# Patient Record
Sex: Female | Born: 1950
Health system: Southern US, Community
[De-identification: ages and names within clinical notes are randomized; demographics above are authoritative.]

## PROBLEM LIST (undated history)

## (undated) DIAGNOSIS — A048 Other specified bacterial intestinal infections: Secondary | ICD-10-CM

## (undated) DIAGNOSIS — K449 Diaphragmatic hernia without obstruction or gangrene: Secondary | ICD-10-CM

## (undated) DIAGNOSIS — I10 Essential (primary) hypertension: Secondary | ICD-10-CM

## (undated) DIAGNOSIS — E785 Hyperlipidemia, unspecified: Secondary | ICD-10-CM

## (undated) DIAGNOSIS — K219 Gastro-esophageal reflux disease without esophagitis: Secondary | ICD-10-CM

## (undated) DIAGNOSIS — N189 Chronic kidney disease, unspecified: Secondary | ICD-10-CM

## (undated) DIAGNOSIS — E559 Vitamin D deficiency, unspecified: Secondary | ICD-10-CM

## (undated) DIAGNOSIS — H269 Unspecified cataract: Secondary | ICD-10-CM

## (undated) HISTORY — DX: Diaphragmatic hernia without obstruction or gangrene: K44.9

## (undated) HISTORY — DX: Vitamin D deficiency, unspecified: E55.9

## (undated) HISTORY — DX: Chronic kidney disease, unspecified: N18.9

## (undated) HISTORY — PX: OTHER SURGICAL HISTORY: SHX169

## (undated) HISTORY — DX: Gastro-esophageal reflux disease without esophagitis: K21.9

## (undated) HISTORY — DX: Other specified bacterial intestinal infections: A04.8

## (undated) HISTORY — DX: Unspecified cataract: H26.9

## (undated) HISTORY — DX: Essential (primary) hypertension: I10

## (undated) HISTORY — DX: Hyperlipidemia, unspecified: E78.5

---

## 1975-10-13 DIAGNOSIS — N189 Chronic kidney disease, unspecified: Secondary | ICD-10-CM

## 1975-10-13 HISTORY — DX: Chronic kidney disease, unspecified: N18.9

## 2011-03-05 ENCOUNTER — Encounter: Payer: Self-pay | Admitting: Nurse Practitioner

## 2011-03-05 DIAGNOSIS — M1990 Unspecified osteoarthritis, unspecified site: Secondary | ICD-10-CM | POA: Insufficient documentation

## 2011-03-05 DIAGNOSIS — K449 Diaphragmatic hernia without obstruction or gangrene: Secondary | ICD-10-CM | POA: Insufficient documentation

## 2011-03-05 DIAGNOSIS — E559 Vitamin D deficiency, unspecified: Secondary | ICD-10-CM | POA: Insufficient documentation

## 2011-03-05 DIAGNOSIS — E785 Hyperlipidemia, unspecified: Secondary | ICD-10-CM | POA: Insufficient documentation

## 2011-03-05 DIAGNOSIS — I10 Essential (primary) hypertension: Secondary | ICD-10-CM

## 2011-03-05 DIAGNOSIS — N281 Cyst of kidney, acquired: Secondary | ICD-10-CM | POA: Insufficient documentation

## 2011-03-05 DIAGNOSIS — K219 Gastro-esophageal reflux disease without esophagitis: Secondary | ICD-10-CM

## 2012-06-12 HISTORY — PX: JOINT REPLACEMENT: SHX530

## 2013-02-15 ENCOUNTER — Encounter: Payer: Self-pay | Admitting: Nurse Practitioner

## 2013-02-15 ENCOUNTER — Ambulatory Visit (INDEPENDENT_AMBULATORY_CARE_PROVIDER_SITE_OTHER): Payer: 59 | Admitting: Nurse Practitioner

## 2013-02-15 VITALS — BP 112/70 | HR 66 | Temp 98.3°F | Ht 69.5 in | Wt 197.0 lb

## 2013-02-15 DIAGNOSIS — K219 Gastro-esophageal reflux disease without esophagitis: Secondary | ICD-10-CM

## 2013-02-15 DIAGNOSIS — I10 Essential (primary) hypertension: Secondary | ICD-10-CM

## 2013-02-15 DIAGNOSIS — E559 Vitamin D deficiency, unspecified: Secondary | ICD-10-CM

## 2013-02-15 DIAGNOSIS — E785 Hyperlipidemia, unspecified: Secondary | ICD-10-CM

## 2013-02-15 LAB — COMPLETE METABOLIC PANEL WITH GFR
ALT: 25 U/L (ref 0–35)
AST: 16 U/L (ref 0–37)
Albumin: 4.4 g/dL (ref 3.5–5.2)
Alkaline Phosphatase: 70 U/L (ref 39–117)
BUN: 29 mg/dL — ABNORMAL HIGH (ref 6–23)
CO2: 27 mEq/L (ref 19–32)
Calcium: 9.8 mg/dL (ref 8.4–10.5)
Chloride: 101 mEq/L (ref 96–112)
Creat: 0.93 mg/dL (ref 0.50–1.10)
GFR, Est African American: 77 mL/min
GFR, Est Non African American: 67 mL/min
Glucose, Bld: 109 mg/dL — ABNORMAL HIGH (ref 70–99)
Potassium: 4.2 mEq/L (ref 3.5–5.3)
Sodium: 138 mEq/L (ref 135–145)
Total Bilirubin: 0.7 mg/dL (ref 0.3–1.2)
Total Protein: 6.7 g/dL (ref 6.0–8.3)

## 2013-02-15 MED ORDER — LOSARTAN POTASSIUM 50 MG PO TABS: 50.0000 mg | ORAL_TABLET | Freq: Every day | ORAL | Status: DC

## 2013-02-15 MED ORDER — HYDROCHLOROTHIAZIDE 25 MG PO TABS: 25.0000 mg | ORAL_TABLET | Freq: Every day | ORAL | Status: DC

## 2013-02-15 MED ORDER — OMEPRAZOLE-SODIUM BICARBONATE 20-1100 MG PO CAPS: 1.0000 | ORAL_CAPSULE | Freq: Every day | ORAL | Status: DC | PRN

## 2013-02-15 NOTE — Progress Notes (Signed)
Subjective:    Patient ID: Samantha Olson, female    DOB: 15-Jun-1951, 62 y.o.   MRN: [id number]  Gastrophageal Reflux She complains of belching. She reports no abdominal pain, no chest pain, no choking, no coughing, no dysphagia or no sore throat. This is a chronic problem. The current episode started more than 1 year ago. The problem occurs frequently. The symptoms are aggravated by caffeine and lying down. Treatments tried: patient has not been taking zegrid lately.  Hyperlipidemia This is a chronic problem. The current episode started more than 1 year ago. The problem is controlled. Recent lipid tests were reviewed and are low. Pertinent negatives include no chest pain, focal weakness, leg pain, myalgias or shortness of breath. She is currently on no antihyperlipidemic treatment (use to be on pravastatin but patient just quit taking.). Compliance problems include adherence to diet and adherence to exercise.   Hypertension This is a chronic problem. The current episode started more than 1 year ago. The problem is unchanged. The problem is controlled. Pertinent negatives include no chest pain, headaches, malaise/fatigue, peripheral edema, shortness of breath or sweats. There are no associated agents to hypertension. Risk factors for coronary artery disease include obesity and post-menopausal state. Past treatments include angiotensin blockers and diuretics. Compliance problems include diet and exercise.   Vitamin D deficiency Vitamin D OTC- No side effects    Review of Systems  Constitutional: Negative for malaise/fatigue.  HENT: Negative for sore throat.   Respiratory: Negative for cough, choking and shortness of breath.   Cardiovascular: Negative for chest pain.  Gastrointestinal: Negative for dysphagia and abdominal pain.  Musculoskeletal: Negative for myalgias.  Neurological: Negative for focal weakness and headaches.  All other systems reviewed and are negative.       Objective:   Physical Exam  Constitutional: She is oriented to person, place, and time. She appears well-developed and well-nourished.  HENT:  Nose: Nose normal.  Mouth/Throat: Oropharynx is clear and moist.  Eyes: EOM are normal.  Neck: Trachea normal, normal range of motion and full passive range of motion without pain. Neck supple. No JVD present. Carotid bruit is not present. No thyromegaly present.  Cardiovascular: Normal rate, regular rhythm, normal heart sounds and intact distal pulses.  Exam reveals no gallop and no friction rub.   No murmur heard. Pulmonary/Chest: Effort normal and breath sounds normal.  Abdominal: Soft. Bowel sounds are normal. She exhibits no distension and no mass. There is no tenderness.  Musculoskeletal: Normal range of motion.  Lymphadenopathy:    She has no cervical adenopathy.  Neurological: She is alert and oriented to person, place, and time. She has normal reflexes.  Skin: Skin is warm and dry.  Psychiatric: She has a normal mood and affect. Her behavior is normal. Judgment and thought content normal.   BP 112/70  Pulse 66  Temp(Src) 98.3 F (36.8 C) (Oral)  Ht 5' 9.5" (1.765 m)  Wt 197 lb (89.359 kg)  BMI 28.68 kg/m2        Assessment & Plan:  1. Hypertension Low Na+ diet - COMPLETE METABOLIC PANEL WITH GFR - losartan (COZAAR) 50 MG tablet; Take 1 tablet (50 mg total) by mouth daily.  Dispense: 30 tablet; Refill: 5 - hydrochlorothiazide (HYDRODIURIL) 25 MG tablet; Take 1 tablet (25 mg total) by mouth daily.  Dispense: 30 tablet; Refill: 5  2. Hyperlipidemia Low fat diet and exercise Will decide on going back on statin once get labs back - NMR Lipoprofile with Lipids  3. GERD (  gastroesophageal reflux disease) Avoid spicy and fatty foods Do Not eat 2 hours prior to bedtime - Omeprazole-Sodium Bicarbonate (ZEGERID) 20-1100 MG CAPS; Take 1 capsule by mouth daily as needed.  Dispense: 30 each; Refill: 5  4. Vitamin D deficiency - Vitamin D 25  hydroxy  Mary-Margaret Daphine Deutscher, FNP

## 2013-02-15 NOTE — Patient Instructions (Signed)
1. Hypertension Low Na+ diet - COMPLETE METABOLIC PANEL WITH GFR - losartan (COZAAR) 50 MG tablet; Take 1 tablet (50 mg total) by mouth daily.  Dispense: 30 tablet; Refill: 5 - hydrochlorothiazide (HYDRODIURIL) 25 MG tablet; Take 1 tablet (25 mg total) by mouth daily.  Dispense: 30 tablet; Refill: 5  2. Hyperlipidemia Low fat diet and exercise Will decide on going back on statin once get labs back - NMR Lipoprofile with Lipids  3. GERD (gastroesophageal reflux disease) Avoid spicy and fatty foods Do Not eat 2 hours prior to bedtime - Omeprazole-Sodium Bicarbonate (ZEGERID) 20-1100 MG CAPS; Take 1 capsule by mouth daily as needed.  Dispense: 30 each; Refill: 5  4. Vitamin D deficiency - Vitamin D 25 hydroxy  Samantha Daphine Deutscher, FNP

## 2013-02-16 LAB — VITAMIN D 25 HYDROXY (VIT D DEFICIENCY, FRACTURES): Vit D, 25-Hydroxy: 62 ng/mL (ref 30–89)

## 2013-02-17 LAB — NMR LIPOPROFILE WITH LIPIDS
Cholesterol, Total: 203 mg/dL — ABNORMAL HIGH (ref <200)
HDL Particle Number: 41.4 umol/L (ref >=30.5)
HDL Size: 9.5 nm (ref >=9.2)
HDL-C: 74 mg/dL (ref >=40)
LDL (calc): 114 mg/dL — ABNORMAL HIGH (ref <100)
LDL Particle Number: 1280 nmol/L — ABNORMAL HIGH (ref <1000)
LDL Size: 21.7 nm (ref >20.5)
LP-IR Score: 31 (ref <=45)
Large HDL-P: 10.8 umol/L (ref >=4.8)
Large VLDL-P: 3.4 nmol/L — ABNORMAL HIGH (ref <=2.7)
Small LDL Particle Number: 273 nmol/L (ref <=527)
Triglycerides: 75 mg/dL (ref <150)
VLDL Size: 46.8 nm — ABNORMAL HIGH (ref <=46.6)

## 2013-06-30 ENCOUNTER — Ambulatory Visit (INDEPENDENT_AMBULATORY_CARE_PROVIDER_SITE_OTHER): Payer: 59 | Admitting: Family Medicine

## 2013-06-30 ENCOUNTER — Ambulatory Visit (INDEPENDENT_AMBULATORY_CARE_PROVIDER_SITE_OTHER): Payer: 59

## 2013-06-30 ENCOUNTER — Encounter: Payer: Self-pay | Admitting: Family Medicine

## 2013-06-30 VITALS — BP 124/75 | HR 67 | Temp 96.7°F | Ht 69.5 in | Wt 195.0 lb

## 2013-06-30 DIAGNOSIS — M545 Low back pain, unspecified: Secondary | ICD-10-CM

## 2013-06-30 DIAGNOSIS — S335XXA Sprain of ligaments of lumbar spine, initial encounter: Secondary | ICD-10-CM

## 2013-06-30 DIAGNOSIS — S39012A Strain of muscle, fascia and tendon of lower back, initial encounter: Secondary | ICD-10-CM

## 2013-06-30 MED ORDER — PREDNISONE 50 MG PO TABS: ORAL_TABLET | ORAL | Status: DC

## 2013-06-30 MED ORDER — CYCLOBENZAPRINE HCL 10 MG PO TABS: 10.0000 mg | ORAL_TABLET | Freq: Three times a day (TID) | ORAL | Status: DC | PRN

## 2013-06-30 NOTE — Progress Notes (Signed)
  Subjective:    Patient ID: Samantha Olson, female    DOB: 12-23-1950, 62 y.o.   MRN: [id number]  HPI  The patient presents today with back pain. Location: lumbar region  Timing: constant. Has been present for last 1-2 weeks. Modifying factors: Baseline hx/o arthritis s/p R TKR 1 year ago  Description: Persistent mild to moderate lumbar back pain.  Worse with: standing, flexion, extension  Better with: some rest  Trauma: dogged pulled her when she was walking him Bladder/bowel incontinence: no Weakness: no, has had some mild radiation into buttocks bilaterally  Fever/chills: no Night pain:no Unexplained weight loss: no Cancer/immunosuppression: no PMH of osteoporosis or chronic steroid use:  no     Review of Systems  All other systems reviewed and are negative.       Objective:   Physical Exam  Constitutional: She appears well-developed and well-nourished.  HENT:  Head: Normocephalic and atraumatic.  Eyes: Conjunctivae are normal. Pupils are equal, round, and reactive to light.  Neck: Normal range of motion. Neck supple.  Cardiovascular: Normal rate and regular rhythm.   Pulmonary/Chest: Effort normal and breath sounds normal.  Abdominal: Soft.  Musculoskeletal:       Arms: + TTP in lumbar region.  FABER negative.  Neurovascularly intact distally    Neurological: She is alert.  Skin: Skin is warm.    [facility] reading (PRIMARY) by  Dr. Alvester Morin Preliminary read: Noted degenerative changes consistent arthritis. Questionable loss of height space and L5/S1 disc space. No acute fracture dislocation preliminary.                                        Assessment & Plan:  Low back pain - Plan: DG Lumbar Spine 2-3 Views  Lumbar strain, initial encounter - Plan: predniSONE (DELTASONE) 50 MG tablet, cyclobenzaprine (FLEXERIL) 10 MG tablet  Will treat with prednisone Flexeril for acute treatment. Neurovascularly intact on exam. Discussed general care musculoskeletal red  flags. Followup as needed.

## 2013-07-27 ENCOUNTER — Encounter: Payer: Self-pay | Admitting: Family Medicine

## 2013-07-27 ENCOUNTER — Ambulatory Visit (INDEPENDENT_AMBULATORY_CARE_PROVIDER_SITE_OTHER): Payer: 59 | Admitting: Family Medicine

## 2013-07-27 VITALS — BP 129/78 | Temp 97.3°F | Resp 72 | Wt 194.2 lb

## 2013-07-27 DIAGNOSIS — I10 Essential (primary) hypertension: Secondary | ICD-10-CM

## 2013-07-27 MED ORDER — LOSARTAN POTASSIUM 50 MG PO TABS: 50.0000 mg | ORAL_TABLET | Freq: Every day | ORAL | Status: DC

## 2013-07-27 MED ORDER — HYDROCHLOROTHIAZIDE 25 MG PO TABS: 25.0000 mg | ORAL_TABLET | Freq: Every day | ORAL | Status: DC

## 2013-07-27 NOTE — Patient Instructions (Signed)

## 2013-07-27 NOTE — Progress Notes (Signed)
  Subjective:    Patient ID: Samantha Olson, female    DOB: Jun 12, 1951, 62 y.o.   MRN: [id number]  HPI This 62 y.o. female presents for evaluation of hypertension.  She has No acute complaints.   Review of Systems No chest pain, SOB, HA, dizziness, vision change, N/V, diarrhea, constipation, dysuria, urinary urgency or frequency, myalgias, arthralgias or rash.     Objective:   Physical Exam Vital signs noted  Well developed well nourished female.  HEENT - Head atraumatic Normocephalic                Eyes - PERRLA, Conjuctiva - clear Sclera- Clear EOMI                Ears - EAC's Wnl TM's Wnl Gross Hearing WNL                Nose - Nares patent                 Throat - oropharanx wnl Respiratory - Lungs CTA bilateral Cardiac - RRR S1 and S2 without murmur GI - Abdomen soft Nontender and bowel sounds active x 4 Extremities - No edema. Neuro - Grossly intact.       Assessment & Plan:  Hypertension - Plan: hydrochlorothiazide (HYDRODIURIL) 25 MG tablet, losartan (COZAAR) 50 MG tablet  Follow up in 6 months  Deatra Canter FNP

## 2014-01-22 ENCOUNTER — Ambulatory Visit (INDEPENDENT_AMBULATORY_CARE_PROVIDER_SITE_OTHER): Payer: 59 | Admitting: Family Medicine

## 2014-01-22 ENCOUNTER — Encounter: Payer: Self-pay | Admitting: Family Medicine

## 2014-01-22 VITALS — BP 122/74 | HR 74 | Temp 97.0°F | Ht 69.5 in | Wt 202.0 lb

## 2014-01-22 DIAGNOSIS — I10 Essential (primary) hypertension: Secondary | ICD-10-CM

## 2014-01-22 DIAGNOSIS — M129 Arthropathy, unspecified: Secondary | ICD-10-CM

## 2014-01-22 DIAGNOSIS — M1990 Unspecified osteoarthritis, unspecified site: Secondary | ICD-10-CM

## 2014-01-22 MED ORDER — MELOXICAM 15 MG PO TABS: 15.0000 mg | ORAL_TABLET | Freq: Every day | ORAL | Status: DC

## 2014-01-22 MED ORDER — HYDROCHLOROTHIAZIDE 25 MG PO TABS: 25.0000 mg | ORAL_TABLET | Freq: Every day | ORAL | Status: DC

## 2014-01-22 MED ORDER — LOSARTAN POTASSIUM 50 MG PO TABS: 50.0000 mg | ORAL_TABLET | Freq: Every day | ORAL | Status: DC

## 2014-01-22 NOTE — Progress Notes (Signed)
   Subjective:    Patient ID: Samantha Olson, female    DOB: [birth year], 63 y.o.   MRN: [id number]  HPI  This 63 y.o. female presents for evaluation of arthritis in her knees and needs refills on bp medication. She would like to wait on labs until next visit.  Review of Systems No chest pain, SOB, HA, dizziness, vision change, N/V, diarrhea, constipation, dysuria, urinary urgency or frequency, myalgias, arthralgias or rash.     Objective:   Physical Exam Vital signs noted  Well developed well nourished female.  HEENT - Head atraumatic Normocephalic                Eyes - PERRLA, Conjuctiva - clear Sclera- Clear EOMI                Ears - EAC's Wnl TM's Wnl Gross Hearing WNL                Throat - oropharanx wnl Respiratory - Lungs CTA bilateral Cardiac - RRR S1 and S2 without murmur GI - Abdomen soft Nontender and bowel sounds active x 4 Extremities - No edema. Neuro - Grossly intact. MS - Tenderness bilateral knees       Assessment & Plan:  Hypertension - Plan: hydrochlorothiazide (HYDRODIURIL) 25 MG tablet, losartan (COZAAR) 50 MG tablet  Arthritis - Plan: meloxicam (MOBIC) 15 MG tablet  Labs next visit  Follow up in 6 months  Lysbeth Penner FNP

## 2014-02-05 LAB — HM MAMMOGRAPHY

## 2014-03-30 ENCOUNTER — Encounter: Payer: Self-pay | Admitting: Nurse Practitioner

## 2014-03-30 ENCOUNTER — Ambulatory Visit (INDEPENDENT_AMBULATORY_CARE_PROVIDER_SITE_OTHER): Payer: 59 | Admitting: Nurse Practitioner

## 2014-03-30 VITALS — BP 110/72 | HR 80 | Temp 98.1°F | Ht 69.0 in | Wt 200.0 lb

## 2014-03-30 DIAGNOSIS — B085 Enteroviral vesicular pharyngitis: Secondary | ICD-10-CM

## 2014-03-30 DIAGNOSIS — J029 Acute pharyngitis, unspecified: Secondary | ICD-10-CM

## 2014-03-30 LAB — POCT RAPID STREP A (OFFICE): Rapid Strep A Screen: NEGATIVE

## 2014-03-30 NOTE — Progress Notes (Signed)
   Subjective:    Patient ID: Samantha Olson, female    DOB: 10/03/1951, 63 y.o.   MRN: [id number]  HPI Patient here today c/o sore throat that started Wednesday- sore all day longer- pain with swallowing- no fever.    Review of Systems  Constitutional: Positive for chills and fatigue. Negative for fever.  HENT: Positive for congestion, sore throat and trouble swallowing. Negative for ear pain, postnasal drip, rhinorrhea and sinus pressure.   Respiratory: Negative for cough.   Cardiovascular: Negative.   Gastrointestinal: Negative.   Neurological: Negative.   Psychiatric/Behavioral: Negative.   All other systems reviewed and are negative.      Objective:   Physical Exam  Constitutional: She is oriented to person, place, and time. She appears well-developed and well-nourished. No distress.  HENT:  Right Ear: External ear normal.  Left Ear: External ear normal.  Nose: No mucosal edema or rhinorrhea. Right sinus exhibits no maxillary sinus tenderness and no frontal sinus tenderness. Left sinus exhibits no maxillary sinus tenderness and no frontal sinus tenderness.  Mouth/Throat: Mucous membranes are normal. Oral lesions (vesicular lesions on uvula) present. Posterior oropharyngeal erythema present.  Eyes: Pupils are equal, round, and reactive to light.  Neck: Normal range of motion. Neck supple.  Cardiovascular: Normal rate, regular rhythm and normal heart sounds.   Pulmonary/Chest: Effort normal.  Lymphadenopathy:    She has no cervical adenopathy.  Neurological: She is alert and oriented to person, place, and time.  Skin: Skin is warm.  Psychiatric: She has a normal mood and affect. Her behavior is normal. Judgment and thought content normal.    BP 110/72  Pulse 80  Temp(Src) 98.1 F (36.7 C) (Oral)  Ht 5\' 9"  (1.753 m)  Wt 200 lb (90.719 kg)  BMI 29.52 kg/m2  Results for orders placed in visit on 03/30/14  POCT RAPID STREP A (OFFICE)      Result Value Ref Range   Rapid  Strep A Screen Negative  Negative        Assessment & Plan:   1. Sore throat   2. Herpangina    Throat lozgenses Cold foods Gargle with warm salt water Force fluids Motrin and tylenol alternate every 3 hours till better  Mary-Margaret Hassell Done, FNP

## 2014-03-30 NOTE — Patient Instructions (Signed)

## 2014-07-31 ENCOUNTER — Ambulatory Visit (INDEPENDENT_AMBULATORY_CARE_PROVIDER_SITE_OTHER): Payer: 59

## 2014-07-31 DIAGNOSIS — Z23 Encounter for immunization: Secondary | ICD-10-CM

## 2014-08-23 ENCOUNTER — Encounter: Payer: Self-pay | Admitting: Family Medicine

## 2014-08-23 ENCOUNTER — Ambulatory Visit (INDEPENDENT_AMBULATORY_CARE_PROVIDER_SITE_OTHER): Payer: 59 | Admitting: Family Medicine

## 2014-08-23 ENCOUNTER — Telehealth: Payer: Self-pay | Admitting: Nurse Practitioner

## 2014-08-23 ENCOUNTER — Ambulatory Visit (INDEPENDENT_AMBULATORY_CARE_PROVIDER_SITE_OTHER): Payer: 59

## 2014-08-23 VITALS — BP 135/76 | HR 86 | Temp 96.9°F | Ht 69.0 in | Wt 202.0 lb

## 2014-08-23 DIAGNOSIS — R8281 Pyuria: Secondary | ICD-10-CM

## 2014-08-23 DIAGNOSIS — N390 Urinary tract infection, site not specified: Secondary | ICD-10-CM

## 2014-08-23 DIAGNOSIS — G629 Polyneuropathy, unspecified: Secondary | ICD-10-CM

## 2014-08-23 DIAGNOSIS — R109 Unspecified abdominal pain: Secondary | ICD-10-CM

## 2014-08-23 DIAGNOSIS — S39012A Strain of muscle, fascia and tendon of lower back, initial encounter: Secondary | ICD-10-CM

## 2014-08-23 LAB — POCT UA - MICROSCOPIC ONLY
Casts, Ur, LPF, POC: NEGATIVE
Crystals, Ur, HPF, POC: NEGATIVE
Mucus, UA: NEGATIVE
Yeast, UA: NEGATIVE

## 2014-08-23 LAB — POCT URINALYSIS DIPSTICK
Bilirubin, UA: NEGATIVE
Blood, UA: NEGATIVE
Glucose, UA: NEGATIVE
Ketones, UA: NEGATIVE
Nitrite, UA: NEGATIVE
Protein, UA: NEGATIVE
Spec Grav, UA: 1.02
Urobilinogen, UA: NEGATIVE
pH, UA: 6

## 2014-08-23 MED ORDER — MELOXICAM 15 MG PO TABS: ORAL_TABLET | ORAL | Status: DC

## 2014-08-23 NOTE — Telephone Encounter (Signed)
Appt given per patient request 

## 2014-08-23 NOTE — Progress Notes (Signed)
Subjective:    Patient ID: Samantha Olson, female    DOB: Oct 01, 1951, 63 y.o.   MRN: [id number]  HPI Patient here today for left side abdominal pain that started on Monday. The patient indicates that she was doing some lifting of boxes and packages during the weekend and on Monday. The discomfort in her left side above the pelvic ramus started Monday evening. The pain wakes sharp in nature. It would come and go. It was not necessarily related to activity but she did comment that when she was in the bed at night that movement does seem to aggravate the discomfort. She does have this remote history of an ovarian cyst and a kidney cyst. She had a pelvic exam in September and was told that everything was fine with a pelvic exam.       Patient Active Problem List   Diagnosis Date Noted  . HTN (hypertension) 03/05/2011  . Arthritis 03/05/2011  . GERD (gastroesophageal reflux disease) 03/05/2011  . Hiatal hernia 03/05/2011  . Hyperlipidemia 03/05/2011  . Vitamin D deficiency 03/05/2011  . Renal cyst 03/05/2011   Outpatient Encounter Prescriptions as of 08/23/2014  Medication Sig  . Cholecalciferol (VITAMIN D) 2000 UNITS CAPS Take by mouth. 1 daily    . hydrochlorothiazide (HYDRODIURIL) 25 MG tablet Take 1 tablet (25 mg total) by mouth daily.  Marland Kitchen losartan (COZAAR) 50 MG tablet Take 1 tablet (50 mg total) by mouth daily.  Earney Navy Bicarbonate (ZEGERID) 20-1100 MG CAPS Take 1 capsule by mouth daily as needed.  . [DISCONTINUED] meloxicam (MOBIC) 15 MG tablet Take 1 tablet (15 mg total) by mouth daily.    Review of Systems  Constitutional: Negative.  Negative for fever.  HENT: Negative.   Eyes: Negative.   Respiratory: Negative.   Cardiovascular: Negative.   Gastrointestinal: Positive for abdominal pain (left side ).  Endocrine: Negative.   Genitourinary: Negative.  Negative for dysuria, urgency and frequency.  Musculoskeletal: Negative.   Skin: Negative.     Allergic/Immunologic: Negative.   Neurological: Negative.   Hematological: Negative.   Psychiatric/Behavioral: Negative.        Objective:   Physical Exam  Constitutional: She is oriented to person, place, and time. She appears well-developed and well-nourished. No distress.  HENT:  Head: Normocephalic.  Eyes: Conjunctivae and EOM are normal. Pupils are equal, round, and reactive to light. Right eye exhibits no discharge. Left eye exhibits no discharge. No scleral icterus.  Neck: Normal range of motion.  Cardiovascular: Normal rate, regular rhythm and normal heart sounds.   No murmur heard. Pulmonary/Chest: Effort normal and breath sounds normal. No respiratory distress. She has no wheezes. She has no rales. She exhibits no tenderness.  Abdominal: Soft. Bowel sounds are normal. She exhibits no mass. There is tenderness. There is no rebound and no guarding.  There is slight tenderness left side of abdomen with no masses or inguinal adenopathy  Musculoskeletal: Normal range of motion. She exhibits no edema.  Neurological: She is alert and oriented to person, place, and time. She has normal reflexes. No cranial nerve deficit.  Leg raising was good bilaterally. Hip abduction and abduction were good bilaterally. Leg raising against pressure was good bilaterally. The patient was able to get up from the supine position without any occurrence of pain in her side.  Skin: Skin is warm and dry.  Psychiatric: She has a normal mood and affect. Her behavior is normal. Judgment and thought content normal.  Nursing note and vitals reviewed.  BP 135/76 mmHg  Pulse 86  Temp(Src) 96.9 F (36.1 C) (Oral)  Ht _0  (1.753 m)  Wt 202 lb (91.627 kg)  BMI 29.82 kg/m2  [facility] reading (PRIMARY) by  Dr. France Ravens--  Degenerative changes L1 and 2 and L5-S1                           Results for orders placed or performed in visit on 08/23/14  POCT urinalysis dipstick  Result Value Ref Range   Color, UA  gold    Clarity, UA clear    Glucose, UA negative    Bilirubin, UA negative    Ketones, UA negative    Spec Grav, UA 1.020    Blood, UA negative    pH, UA 6.0    Protein, UA negative    Urobilinogen, UA negative    Nitrite, UA negative    Leukocytes, UA moderate (2+)   POCT UA - Microscopic Only  Result Value Ref Range   WBC, Ur, HPF, POC 10-12    RBC, urine, microscopic 1-3    Bacteria, U Microscopic few    Mucus, UA negative    Epithelial cells, urine per micros few    Crystals, Ur, HPF, POC negative    Casts, Ur, LPF, POC negative    Yeast, UA negative    The patient was informed of the urinalysis result and the CBC result. A culture and sensitivity is pending on the urine. We will not treat this unless the culture comes back positive and the patient was aware of this.           Assessment & Plan:  1. Left sided abdominal pain - BMP8+EGFR - POCT CBC - POCT urinalysis dipstick - POCT UA - Microscopic Only - Urine culture - meloxicam (MOBIC) 15 MG tablet; 1/2 to 1 whole tab daily after eating  Dispense: 30 tablet; Refill: 0 - DG Lumbar Spine 2-3 Views; Future  2. Neuropathy - POCT urinalysis dipstick - POCT UA - Microscopic Only - Urine culture - meloxicam (MOBIC) 15 MG tablet; 1/2 to 1 whole tab daily after eating  Dispense: 30 tablet; Refill: 0 - DG Lumbar Spine 2-3 Views; Future  3. Low back strain, initial encounter  Patient Instructions  Avoid heavy lifting pushing or pulling or straining Use warm wet compresses to low back Take NSAID as directed   Arrie Senate MD

## 2014-08-23 NOTE — Patient Instructions (Signed)
Avoid heavy lifting pushing or pulling or straining Use warm wet compresses to low back Take NSAID as directed

## 2014-08-24 ENCOUNTER — Other Ambulatory Visit: Payer: Self-pay | Admitting: Family Medicine

## 2014-08-24 ENCOUNTER — Telehealth: Payer: Self-pay

## 2014-08-24 LAB — BMP8+EGFR
BUN/Creatinine Ratio: 23 (ref 11–26)
BUN: 23 mg/dL (ref 8–27)
CO2: 28 mmol/L (ref 18–29)
Calcium: 10 mg/dL (ref 8.7–10.3)
Chloride: 98 mmol/L (ref 97–108)
Creatinine, Ser: 0.98 mg/dL (ref 0.57–1.00)
GFR calc Af Amer: 71 mL/min/{1.73_m2} (ref >59)
GFR calc non Af Amer: 62 mL/min/{1.73_m2} (ref >59)
Glucose: 122 mg/dL — ABNORMAL HIGH (ref 65–99)
Potassium: 3.6 mmol/L (ref 3.5–5.2)
Sodium: 141 mmol/L (ref 134–144)

## 2014-08-24 NOTE — Telephone Encounter (Signed)
[facility] to x-ray 

## 2014-08-24 NOTE — Telephone Encounter (Signed)
Pt aware of x-ray results and was reminded to call office before she leaves to go oot on Monday for urine culture results

## 2014-08-24 NOTE — Telephone Encounter (Signed)
-----   Message from Chipper Herb, MD sent at 08/24/2014  1:03 PM EST ----- As per radiology report--she has multi-level degenerative changes. This could certainly play a role with her side paincav and see if this helps the discomfort. Hopefully we will have the urine culture back before she leaves on her trip. She should call us on Monday to check the urine culture result if she has not heard from [place] by then.

## 2014-08-25 LAB — URINE CULTURE

## 2015-02-05 ENCOUNTER — Telehealth: Payer: Self-pay | Admitting: Nurse Practitioner

## 2015-02-05 NOTE — Telephone Encounter (Signed)
Appointment given for tomorrow with Tiffany.

## 2015-02-06 ENCOUNTER — Ambulatory Visit (INDEPENDENT_AMBULATORY_CARE_PROVIDER_SITE_OTHER): Payer: 59

## 2015-02-06 ENCOUNTER — Encounter: Payer: Self-pay | Admitting: Physician Assistant

## 2015-02-06 ENCOUNTER — Ambulatory Visit (INDEPENDENT_AMBULATORY_CARE_PROVIDER_SITE_OTHER): Payer: 59 | Admitting: Physician Assistant

## 2015-02-06 VITALS — BP 131/74 | HR 68 | Temp 97.0°F | Ht 69.0 in | Wt 203.0 lb

## 2015-02-06 DIAGNOSIS — M79672 Pain in left foot: Secondary | ICD-10-CM

## 2015-02-06 DIAGNOSIS — E559 Vitamin D deficiency, unspecified: Secondary | ICD-10-CM | POA: Diagnosis not present

## 2015-02-06 DIAGNOSIS — S93602A Unspecified sprain of left foot, initial encounter: Secondary | ICD-10-CM | POA: Diagnosis not present

## 2015-02-06 DIAGNOSIS — I10 Essential (primary) hypertension: Secondary | ICD-10-CM

## 2015-02-06 DIAGNOSIS — Z13220 Encounter for screening for lipoid disorders: Secondary | ICD-10-CM | POA: Diagnosis not present

## 2015-02-06 LAB — POCT CBC
Granulocyte percent: 54.8 %G (ref 37–80)
HCT, POC: 45.9 % (ref 37.7–47.9)
Hemoglobin: 14.6 g/dL (ref 12.2–16.2)
Lymph, poc: 2.4 (ref 0.6–3.4)
MCH, POC: 28 pg (ref 27–31.2)
MCHC: 31.8 g/dL (ref 31.8–35.4)
MCV: 87.8 fL (ref 80–97)
MPV: 8.9 fL (ref 0–99.8)
POC Granulocyte: 3.5 (ref 2–6.9)
POC LYMPH PERCENT: 37.1 %L (ref 10–50)
Platelet Count, POC: 253 10*3/uL (ref 142–424)
RBC: 5.23 M/uL (ref 4.04–5.48)
RDW, POC: 12.7 %
WBC: 6.4 10*3/uL (ref 4.6–10.2)

## 2015-02-06 MED ORDER — LOSARTAN POTASSIUM 50 MG PO TABS: 50.0000 mg | ORAL_TABLET | Freq: Every day | ORAL | Status: DC

## 2015-02-06 MED ORDER — HYDROCHLOROTHIAZIDE 25 MG PO TABS: 25.0000 mg | ORAL_TABLET | Freq: Every day | ORAL | Status: DC

## 2015-02-06 NOTE — Patient Instructions (Signed)
1 Aleve twice daily , may take 2 Aleve twice daily for 1 week if no improvent.  Rest, ice , compression with ace wrap, elevate when possible Do not wear flip flops or shoes that apply pressure to affected area of foot.  F/U in 2 weeks for recheck or sooner if s/s worsen       Foot Sprain The muscles and cord like structures which attach muscle to bone (tendons) that surround the feet are made up of units. A foot sprain can occur at the weakest spot in any of these units. This condition is most often caused by injury to or overuse of the foot, as from playing contact sports, or aggravating a previous injury, or from poor conditioning, or obesity. SYMPTOMS  Pain with movement of the foot.  Tenderness and swelling at the injury site.  Loss of strength is present in moderate or severe sprains. THE THREE GRADES OR SEVERITY OF FOOT SPRAIN ARE:  Mild (Grade I): Slightly pulled muscle without tearing of muscle or tendon fibers or loss of strength.  Moderate (Grade II): Tearing of fibers in a muscle, tendon, or at the attachment to bone, with small decrease in strength.  Severe (Grade III): Rupture of the muscle-tendon-bone attachment, with separation of fibers. Severe sprain requires surgical repair. Often repeating (chronic) sprains are caused by overuse. Sudden (acute) sprains are caused by direct injury or over-use. DIAGNOSIS  Diagnosis of this condition is usually by your own observation. If problems continue, a caregiver may be required for further evaluation and treatment. X-rays may be required to make sure there are not breaks in the bones (fractures) present. Continued problems may require physical therapy for treatment. PREVENTION  Use strength and conditioning exercises appropriate for your sport.  Warm up properly prior to working out.  Use athletic shoes that are made for the sport you are participating in.  Allow adequate time for healing. Early return to activities makes  repeat injury more likely, and can lead to an unstable arthritic foot that can result in prolonged disability. Mild sprains generally heal in 3 to 10 days, with moderate and severe sprains taking 2 to 10 weeks. Your caregiver can help you determine the proper time required for healing. HOME CARE INSTRUCTIONS   Apply ice to the injury for 15-20 minutes, 03-04 times per day. Put the ice in a plastic bag and place a towel between the bag of ice and your skin.  An elastic wrap (like an Ace bandage) may be used to keep swelling down.  Keep foot above the level of the heart, or at least raised on a footstool, when swelling and pain are present.  Try to avoid use other than gentle range of motion while the foot is painful. Do not resume use until instructed by your caregiver. Then begin use gradually, not increasing use to the point of pain. If pain does develop, decrease use and continue the above measures, gradually increasing activities that do not cause discomfort, until you gradually achieve normal use.  Use crutches if and as instructed, and for the length of time instructed.  Keep injured foot and ankle wrapped between treatments.  Massage foot and ankle for comfort and to keep swelling down. Massage from the toes up towards the knee.  Only take over-the-counter or prescription medicines for pain, discomfort, or fever as directed by your caregiver. SEEK IMMEDIATE MEDICAL CARE IF:   Your pain and swelling increase, or pain is not controlled with medications.  You have  loss of feeling in your foot or your foot turns cold or blue.  You develop new, unexplained symptoms, or an increase of the symptoms that brought you to your caregiver. MAKE SURE YOU:   Understand these instructions.  Will watch your condition.  Will get help right away if you are not doing well or get worse. Document Released: 03/20/2002 Document Revised: 12/21/2011 Document Reviewed: 05/17/2008 [facility] Patient  Information 2015 [place], [place]. This information is not intended to replace advice given to you by your health care provider. Make sure you discuss any questions you have with your health care provider.

## 2015-02-06 NOTE — Progress Notes (Signed)
   Subjective:    Patient ID: Samantha Olson, female    DOB: [birth year], 64 y.o.   MRN: [id number]  HPI64 y/o female presents s/p stepping off of a ledge last week. She has been having left foot swelling and pain since the incident. Has tried soaking in hot bath at night and taking Tylenol Arthritis, not on a consistent basis ( once daily). No aggravating or alleviating factors. Pain is better but swelling is worse. No bruising.   Review of Systems  Musculoskeletal:       Pain, swelling on top of left foot near toes. Feels like she's "stepping on a ball" in the ball of her foot.        Objective:   Physical Exam  Constitutional: She appears well-developed and well-nourished.  Musculoskeletal: Normal range of motion. She exhibits edema (trace of edema on dorsal surface of foot near great toe and 2nd toe) and tenderness (pinpoint ttdeep palpation dorsal surface between great toe and 2nd digit. ).  Xray negative for fracture or dislocation   Skin: No erythema.  Nursing note and vitals reviewed.         Assessment & Plan:  1. Acute pain of left foot  - DG Foot Complete Left; Future - RICE, F/u i 2 weeks for recheck - 1 Aleve BID until f/u   2. Foot sprain, left, initial encounter   3. Essential hypertension  - POCT CBC - BMP8+EGFR - hydrochlorothiazide (HYDRODIURIL) 25 MG tablet; Take 1 tablet (25 mg total) by mouth daily.  Dispense: 30 tablet; Refill: 5 - losartan (COZAAR) 50 MG tablet; Take 1 tablet (50 mg total) by mouth daily.  Dispense: 30 tablet; Refill: 5  4. Screening cholesterol level  - Lipid panel  5. Vitamin D deficiency  - Vit D  25 hydroxy (rtn osteoporosis monitoring)   Continue all meds Labs pending Health Maintenance reviewed Diet and exercise encouraged RTO 2 weeks.   Antwyne Pingree A. Benjamin Stain PA-C

## 2015-02-07 LAB — LIPID PANEL
Chol/HDL Ratio: 3.1 ratio units (ref 0.0–4.4)
Cholesterol, Total: 211 mg/dL — ABNORMAL HIGH (ref 100–199)
HDL: 67 mg/dL (ref >39)
LDL Calculated: 113 mg/dL — ABNORMAL HIGH (ref 0–99)
Triglycerides: 154 mg/dL — ABNORMAL HIGH (ref 0–149)
VLDL Cholesterol Cal: 31 mg/dL (ref 5–40)

## 2015-02-07 LAB — BMP8+EGFR
BUN/Creatinine Ratio: 21 (ref 11–26)
BUN: 17 mg/dL (ref 8–27)
CO2: 28 mmol/L (ref 18–29)
Calcium: 10.2 mg/dL (ref 8.7–10.3)
Chloride: 97 mmol/L (ref 97–108)
Creatinine, Ser: 0.81 mg/dL (ref 0.57–1.00)
GFR calc Af Amer: 90 mL/min/{1.73_m2} (ref >59)
GFR calc non Af Amer: 78 mL/min/{1.73_m2} (ref >59)
Glucose: 108 mg/dL — ABNORMAL HIGH (ref 65–99)
Potassium: 4.1 mmol/L (ref 3.5–5.2)
Sodium: 140 mmol/L (ref 134–144)

## 2015-02-07 LAB — VITAMIN D 25 HYDROXY (VIT D DEFICIENCY, FRACTURES): Vit D, 25-Hydroxy: 50.2 ng/mL (ref 30.0–100.0)

## 2015-02-21 ENCOUNTER — Ambulatory Visit: Payer: 59 | Admitting: Physician Assistant

## 2015-05-13 ENCOUNTER — Encounter: Payer: Self-pay | Admitting: *Deleted

## 2015-05-15 ENCOUNTER — Encounter: Payer: Self-pay | Admitting: *Deleted

## 2015-08-09 ENCOUNTER — Ambulatory Visit (INDEPENDENT_AMBULATORY_CARE_PROVIDER_SITE_OTHER): Payer: 59

## 2015-08-09 DIAGNOSIS — Z23 Encounter for immunization: Secondary | ICD-10-CM

## 2015-09-06 ENCOUNTER — Other Ambulatory Visit: Payer: Self-pay | Admitting: Physician Assistant

## 2015-09-06 NOTE — Telephone Encounter (Signed)
last seen 02/06/15  Samantha Olson

## 2015-09-23 ENCOUNTER — Encounter: Payer: Self-pay | Admitting: Pediatrics

## 2015-09-23 ENCOUNTER — Ambulatory Visit (INDEPENDENT_AMBULATORY_CARE_PROVIDER_SITE_OTHER): Payer: 59 | Admitting: Pediatrics

## 2015-09-23 VITALS — BP 122/79 | HR 71 | Temp 97.5°F | Ht 69.0 in | Wt 207.0 lb

## 2015-09-23 DIAGNOSIS — I10 Essential (primary) hypertension: Secondary | ICD-10-CM | POA: Diagnosis not present

## 2015-09-23 DIAGNOSIS — Z1389 Encounter for screening for other disorder: Secondary | ICD-10-CM | POA: Diagnosis not present

## 2015-09-23 DIAGNOSIS — F329 Major depressive disorder, single episode, unspecified: Secondary | ICD-10-CM

## 2015-09-23 DIAGNOSIS — R0683 Snoring: Secondary | ICD-10-CM | POA: Diagnosis not present

## 2015-09-23 DIAGNOSIS — Z1331 Encounter for screening for depression: Secondary | ICD-10-CM

## 2015-09-23 DIAGNOSIS — F32A Depression, unspecified: Secondary | ICD-10-CM

## 2015-09-23 MED ORDER — HYDROCHLOROTHIAZIDE 25 MG PO TABS: 25.0000 mg | ORAL_TABLET | Freq: Every day | ORAL | Status: DC

## 2015-09-23 MED ORDER — LOSARTAN POTASSIUM 50 MG PO TABS: 50.0000 mg | ORAL_TABLET | Freq: Every day | ORAL | Status: DC

## 2015-09-23 MED ORDER — PAROXETINE HCL 10 MG PO TABS: 10.0000 mg | ORAL_TABLET | Freq: Every day | ORAL | Status: DC

## 2015-09-23 NOTE — Patient Instructions (Signed)
Paroxetine half a tab for 8 days Then full tab After 10/23/2014 if not having an effect we can increase to 20 mg, but you will need to call me so I can send in new dose to phamarcy Come back in 6-8 weeks to see me  24 hour a day CRISIS NUMBER: [phone]   List of local counseling services:  [facility] [address] [place] ,[place] [address] [phone] Children limited to anxiety and depression- NO ADD/ADHD Does not accept Medicaid  [facility] [address] [place], [place] Does see children Does accept medicaid Will assess for Autism but not treat  [facility] [place]. [address] [place] [phone] Does see children  Does accept Medicaid Medication management- substance abuse- bipolar- grief- family-marriage- OCD- Anxiety- PTSD  [facility] [place] Does see children Does accept medicaid They do perform psychological testing  [place] [address] [place] Schedule through [place]. [phone] Patient must call and make own appointment Does se children Does accept Medicaid  [facility] [place], [place] 7-10 accompanied by an adult, 11 and up by themselves Does accept Medicaid Will see patients with- substance abuse-ADHD-ADD-Bipolar-Domestic violence-Marriage counseling- Family Counseling and sexual abuse  [place] [facility] [address] [place] [phone] Does see children Does accept [facility] [phone]  Dr. Sabra Heck-  Psychiatrist [address] [place], [place] Specializes in ADHD and addictions They do ADHD testing Suboxone clinic  [facility] [address] [place] Does Child psychological testing  [facility] [address] Dellia [place] [phone] Does Accept Medicaid Evaluates for Autism  [facility] [place] [phone] Does Not accept Medicaid Does do adult ADD evaluations  Dr. Lorenza Evangelist [address], [address] [place] [phone] Does not Take Medicaid Sees ADD and ADHD for treatment      [facility] [address], [place] [address] [phone] Takes Medicaid WIll see children as young as 71

## 2015-09-23 NOTE — Progress Notes (Signed)
Subjective:    Patient ID: Brunetta Genera, female    DOB: 31-Aug-1951, 64 y.o.   MRN: [id number]  CC: BP follow up   HPI: DEMARI KROPP is a 64 y.o. female presenting for BP follow up  BP has been fairly well controlled at home Sometimes has cramps in her hands No headaches or vision changes Compliant with medicine  At least five years of depression/mood problems Driving up to [place] to check on mother every 5- 6 weeks mom's sisters over an hour away 98yo, lives alone in 3 story house  Started on citalopram for depression, had shakiness with it so stopped No thoughts of self harm. Having a hard time sleeping but has been ongoing problem  Snoring a lot, poor sleep. Jerks herself awake. Husband has been wanting her to get checked out. Poor energy, sleepy during day.   Depression screen [facility] 2/9 09/23/2015 01/22/2014  Decreased Interest 3 0  Down, Depressed, Hopeless 2 0  PHQ - 2 Score 5 0  Altered sleeping 3 -  Tired, decreased energy 3 -  Change in appetite 0 -  Feeling bad or failure about yourself  0 -  Trouble concentrating 2 -  Moving slowly or fidgety/restless 1 -  Suicidal thoughts 0 -  PHQ-9 Score 14 -     Relevant past medical, surgical, family and social history reviewed and updated as indicated. Interim medical history since our last visit reviewed. Allergies and medications reviewed and updated.    ROS: Per HPI unless specifically indicated above  History  Smoking status  . Never Smoker   Smokeless tobacco  . Not on file    Past Medical History Patient Active Problem List   Diagnosis Date Noted  . HTN (hypertension) 03/05/2011  . Arthritis 03/05/2011  . GERD (gastroesophageal reflux disease) 03/05/2011  . Hiatal hernia 03/05/2011  . Hyperlipidemia 03/05/2011  . Vitamin D deficiency 03/05/2011  . Renal cyst 03/05/2011    Current Outpatient Prescriptions  Medication Sig Dispense Refill  . Cholecalciferol (VITAMIN D) 2000 UNITS CAPS Take by  mouth. 1 daily      . hydrochlorothiazide (HYDRODIURIL) 25 MG tablet Take 1 tablet (25 mg total) by mouth daily. 30 tablet 5  . losartan (COZAAR) 50 MG tablet Take 1 tablet (50 mg total) by mouth daily. 30 tablet 5  . Omeprazole-Sodium Bicarbonate (ZEGERID) 20-1100 MG CAPS Take 1 capsule by mouth daily as needed. 30 each 5  . PARoxetine (PAXIL) 10 MG tablet Take 1 tablet (10 mg total) by mouth daily. 30 tablet 1   No current facility-administered medications for this visit.       Objective:    BP 122/79 mmHg  Pulse 71  Temp(Src) 97.5 F (36.4 C) (Oral)  Ht 5\' 9"  (1.753 m)  Wt 207 lb (93.895 kg)  BMI 30.55 kg/m2  Wt Readings from Last 3 Encounters:  09/23/15 207 lb (93.895 kg)  02/06/15 203 lb (92.08 kg)  08/23/14 202 lb (91.627 kg)     Gen: NAD, alert, cooperative with exam, NCAT EYES: EOMI, no scleral injection or icterus ENT:  OP without erythema LYMPH: no cervical LAD CV: NRRR, normal S1/S2, no murmur, distal pulses 2+ b/l Resp: CTABL, no wheezes, normal WOB Abd: +BS, soft, NTND. no guarding or organomegaly Ext: No edema, warm Neuro: Alert and oriented, strength equal b/l UE and LE, coordination grossly normal MSK: normal muscle bulk Psych: full affect, no thoughts of self harm     Assessment & Plan:  Deandrea was seen today for bp follow up and multiple other med problems.  Diagnoses and all orders for this visit:  Depression ongoing stressors at home, depressed mood. Was started on citalopram 6 mo ago but didn't take for longer htan a few days due to side effects. Will start paroxetine, half tab for 8 days, then full tab. Call me in 1 month if doing well will increase to $RemoveBef'20mg'Icsujiqrli$ . Will check TSh panel today with depression, altered sleep. F/u in 6-8 weeks. -     Thyroid Panel With TSH -     PARoxetine (PAXIL) 10 MG tablet; Take 1 tablet (10 mg total) by mouth daily.  Essential hypertension well controlled, continue current emdicines. -     hydrochlorothiazide  (HYDRODIURIL) 25 MG tablet; Take 1 tablet (25 mg total) by mouth daily. -     losartan (COZAAR) 50 MG tablet; Take 1 tablet (50 mg total) by mouth daily. -     BMP8+EGFR  Snoring -     Ambulatory referral to Pulmonology for sleep apnea evaluation     Follow up plan: Return in about 6 weeks (around 11/04/2015).  [name], MD [facility] 09/23/2015, 4:46 PM

## 2015-09-24 LAB — BMP8+EGFR
BUN/Creatinine Ratio: 25 (ref 11–26)
BUN: 21 mg/dL (ref 8–27)
CO2: 29 mmol/L (ref 18–29)
Calcium: 10.6 mg/dL — ABNORMAL HIGH (ref 8.7–10.3)
Chloride: 99 mmol/L (ref 96–106)
Creatinine, Ser: 0.84 mg/dL (ref 0.57–1.00)
GFR calc Af Amer: 86 mL/min/{1.73_m2} (ref >59)
GFR calc non Af Amer: 74 mL/min/{1.73_m2} (ref >59)
Glucose: 114 mg/dL — ABNORMAL HIGH (ref 65–99)
Potassium: 4.4 mmol/L (ref 3.5–5.2)
Sodium: 142 mmol/L (ref 134–144)

## 2015-09-24 LAB — THYROID PANEL WITH TSH
Free Thyroxine Index: 1.8 (ref 1.2–4.9)
T3 Uptake Ratio: 26 % (ref 24–39)
T4, Total: 6.9 ug/dL (ref 4.5–12.0)
TSH: 1.09 u[IU]/mL (ref 0.450–4.500)

## 2015-10-15 ENCOUNTER — Encounter: Payer: Self-pay | Admitting: Nurse Practitioner

## 2015-10-15 ENCOUNTER — Ambulatory Visit (INDEPENDENT_AMBULATORY_CARE_PROVIDER_SITE_OTHER): Payer: 59

## 2015-10-15 ENCOUNTER — Ambulatory Visit (INDEPENDENT_AMBULATORY_CARE_PROVIDER_SITE_OTHER): Payer: 59 | Admitting: Nurse Practitioner

## 2015-10-15 VITALS — BP 139/84 | HR 80 | Temp 97.2°F | Ht 69.0 in | Wt 207.7 lb

## 2015-10-15 DIAGNOSIS — S63601A Unspecified sprain of right thumb, initial encounter: Secondary | ICD-10-CM

## 2015-10-15 DIAGNOSIS — S6991XA Unspecified injury of right wrist, hand and finger(s), initial encounter: Secondary | ICD-10-CM

## 2015-10-15 NOTE — Patient Instructions (Signed)
Thumb Sprain A thumb sprain is an injury to one of the strong bands of tissue (ligaments) that connect the bones in your thumb. The ligament can be stretched too much or it can tear. A tear can be either partial or complete. The severity of the sprain depends on how much of the ligament was damaged or torn. CAUSES A thumb sprain is often caused by a fall or an accident. If you extend your hands to catch an object or to protect yourself, the force of the impact can cause your ligament to stretch too much. This excess tension can also cause your ligament to tear. RISK FACTORS This injury is more likely to occur in people who play:  Sports that involve a greater risk of falling, such as skiing.  Sports that involve catching an object, such as basketball. SYMPTOMS Symptoms of this condition include:  Loss of motion in your thumb.  Bruising.  Tenderness.  Swelling. DIAGNOSIS This condition is diagnosed with a medical history and physical exam. You may also have an X-ray of your thumb. TREATMENT Treatment varies depending on the severity of your sprain. If your ligament is overstretched or partially torn, treatment usually involves keeping your thumb in a fixed position (immobilization) for a period of time. To help you do this, your health care provider will apply a bandage, cast, or splint to keep your thumb from moving until it heals. If your ligament is fully torn, you may need surgery to reconnect the ligament to the bone. After surgery, a cast or splint will be applied and will need to stay on your thumb while it heals. Your health care provider may also suggest exercises or physical therapy to strengthen your thumb. HOME CARE INSTRUCTIONS If You Have a Cast:  Do not stick anything inside the cast to scratch your skin. Doing that increases your risk of infection.  Check the skin around the cast every day. Report any concerns to your health care provider. You may put lotion on dry skin  around the edges of the cast. Do not apply lotion to the skin underneath the cast.  Keep the cast clean and dry. If You Have a Splint:  Wear it as directed by your health care provider. Remove it only as directed by your health care provider.  Loosen the splint if your fingers become numb and tingle, or if they turn cold and blue.  Keep the splint clean and dry. Bathing  Cover the bandage, cast, or splint with a watertight plastic bag to protect it from water while you take a bath or a shower. Do not let the bandage, cast, or splint get wet. Managing Pain, Stiffness, and Swelling   If directed, apply ice to the injured area (unless you have a cast):  Put ice in a plastic bag.  Place a towel between your skin and the bag.  Leave the ice on for 20 minutes, 2-3 times per day.  Move your fingers often to avoid stiffness and to lessen swelling.  Raise (elevate) the injured area above the level of your heart while you are sitting or lying down. Driving  Do not drive or operate heavy machinery while taking pain medicine.  Do not drive while wearing a cast or splint on a hand that you use for driving. General Instructions  Do not put pressure on any part of your cast or splint until it is fully hardened. This may take several hours.  Take medicines only as directed by your health  care provider. These include over-the-counter medicines and prescription medicines.  Keep all follow-up visits as directed by your health care provider. This is important.  Do any exercise or physical therapy as directed by your health care provider.  Do not wear rings on your injured thumb. SEEK MEDICAL CARE IF:  Your pain is not controlled with medicine.  Your bruising or swelling gets worse.  Your cast or splint is damaged. SEEK IMMEDIATE MEDICAL CARE IF:  Your thumb is numb or blue.  Your thumb feels colder than normal.   This information is not intended to replace advice given to you by  your health care provider. Make sure you discuss any questions you have with your health care provider.   Document Released: 11/05/2004 Document Revised: 02/12/2015 Document Reviewed: 07/10/2014 Elsevier Interactive Patient Education [employer].

## 2015-10-15 NOTE — Progress Notes (Signed)
   Subjective:    Patient ID: Samantha Olson, female    DOB: Jan 27, 1951, 65 y.o.   MRN: [id number]  HPI Patient said that 2 weeks ago she was was helping her husband build something and a piece pf heavy wood fell and pushed her thumb back. She sais that the pain has been increasing over the last week.    Review of Systems  HENT: Negative.   Respiratory: Negative.   Cardiovascular: Negative.   Genitourinary: Negative.   Neurological: Negative.   Psychiatric/Behavioral: Negative.   All other systems reviewed and are negative.      Objective:   Physical Exam  Constitutional: She is oriented to person, place, and time. She appears well-developed and well-nourished.  Cardiovascular: Normal rate, regular rhythm and normal heart sounds.   Pulmonary/Chest: Effort normal and breath sounds normal.  Musculoskeletal:  FROM of right thumb but very slowly- pulling sensation with oppositiuon to fingers Slight edema at MIP joint.  Neurological: She is alert and oriented to person, place, and time.   BP 139/84 mmHg  Pulse 80  Temp(Src) 97.2 F (36.2 C) (Oral)  Ht 5\' 9"  (1.753 m)  Wt 207 lb 11.2 oz (94.212 kg)  BMI 30.66 kg/m2  Right thumb xray- no fracture- degenerative changes-Preliminary reading by Ronnald Collum, FNP  [facility]       Assessment & Plan:  1. Thumb injury, right, initial encounter - DG Finger Thumb Right; Future  2. Thumb sprain, right, initial encounter Rest thumb Ice BID compression wrap Elevate when can RTO prn  Mary-Margaret Hassell Done, FNP

## 2015-10-21 ENCOUNTER — Ambulatory Visit (INDEPENDENT_AMBULATORY_CARE_PROVIDER_SITE_OTHER): Payer: 59 | Admitting: Pediatrics

## 2015-10-21 ENCOUNTER — Encounter: Payer: Self-pay | Admitting: Pediatrics

## 2015-10-21 VITALS — BP 134/80 | HR 65 | Temp 97.1°F | Ht 69.0 in | Wt 207.4 lb

## 2015-10-21 DIAGNOSIS — J209 Acute bronchitis, unspecified: Secondary | ICD-10-CM | POA: Diagnosis not present

## 2015-10-21 DIAGNOSIS — R05 Cough: Secondary | ICD-10-CM | POA: Diagnosis not present

## 2015-10-21 DIAGNOSIS — F4321 Adjustment disorder with depressed mood: Secondary | ICD-10-CM

## 2015-10-21 DIAGNOSIS — R059 Cough, unspecified: Secondary | ICD-10-CM

## 2015-10-21 MED ORDER — AZITHROMYCIN 250 MG PO TABS: ORAL_TABLET | ORAL | Status: DC

## 2015-10-21 MED ORDER — GUAIFENESIN-CODEINE 100-10 MG/5ML PO SOLN: 5.0000 mL | Freq: Every evening | ORAL | Status: DC | PRN

## 2015-10-21 NOTE — Patient Instructions (Addendum)
Neti pot 3 times a day  Salt water sinus sprays as much as needed  Flonase steroid nasal 2 sprays each nostril daily

## 2015-10-21 NOTE — Progress Notes (Signed)
Subjective:    Patient ID: Samantha Olson, female    DOB: 10-Aug-1951, 66 y.o.   MRN: [id number]  CC: Cough and Nasal Congestion   HPI: Samantha Olson is a 65 y.o. female presenting for Cough and Nasal Congestion  Coughing and congestion for past week, started with cough and sore throat at first, cough got slightly better, thought she was getting better but then last night/today worse Appetite has been slightly off with the drainage No fevers Some muscle aches in the beginning better today    Depression screen [facility] 2/9 10/21/2015 09/23/2015 01/22/2014  Decreased Interest 0 3 0  Down, Depressed, Hopeless - 2 0  PHQ - 2 Score 0 5 0  Altered sleeping - 3 -  Tired, decreased energy - 3 -  Change in appetite - 0 -  Feeling bad or failure about yourself  - 0 -  Trouble concentrating - 2 -  Moving slowly or fidgety/restless - 1 -  Suicidal thoughts - 0 -  PHQ-9 Score - 14 -     Relevant past medical, surgical, family and social history reviewed and updated as indicated. Interim medical history since our last visit reviewed. Allergies and medications reviewed and updated.    ROS: Per HPI unless specifically indicated above  History  Smoking status  . Never Smoker   Smokeless tobacco  . Not on file    Past Medical History Patient Active Problem List   Diagnosis Date Noted  . HTN (hypertension) 03/05/2011  . Arthritis 03/05/2011  . GERD (gastroesophageal reflux disease) 03/05/2011  . Hiatal hernia 03/05/2011  . Hyperlipidemia 03/05/2011  . Vitamin D deficiency 03/05/2011  . Renal cyst 03/05/2011    Current Outpatient Prescriptions  Medication Sig Dispense Refill  . Cholecalciferol (VITAMIN D) 2000 UNITS CAPS Take by mouth. 1 daily      . hydrochlorothiazide (HYDRODIURIL) 25 MG tablet Take 1 tablet (25 mg total) by mouth daily. 30 tablet 5  . losartan (COZAAR) 50 MG tablet Take 1 tablet (50 mg total) by mouth daily. 30 tablet 5  . azithromycin (ZITHROMAX) 250 MG tablet  Take 2 the first day and then one each day after. 6 tablet 0  . guaiFENesin-codeine 100-10 MG/5ML syrup Take 5 mLs by mouth at bedtime as needed for cough. 120 mL 0  . PARoxetine (PAXIL) 10 MG tablet Take 1 tablet (10 mg total) by mouth daily. (Patient not taking: Reported on 10/21/2015) 30 tablet 1   No current facility-administered medications for this visit.       Objective:    BP 134/80 mmHg  Pulse 65  Temp(Src) 97.1 F (36.2 C) (Oral)  Ht 5\' 9"  (1.753 m)  Wt 207 lb 6.4 oz (94.076 kg)  BMI 30.61 kg/m2  SpO2 99%  Wt Readings from Last 3 Encounters:  10/21/15 207 lb 6.4 oz (94.076 kg)  10/15/15 207 lb 11.2 oz (94.212 kg)  09/23/15 207 lb (93.895 kg)     Gen: NAD, alert, cooperative with exam, NCAT EYES: EOMI, no scleral injection or icterus ENT:  R TM erythematous, L TM red, normal LR, pearly gray b/l, OP with mild erythema, no tenderness over maxiallary sinuses LYMPH: no cervical LAD CV: NRRR, normal S1/S2, no murmur, distal pulses 2+ b/l Resp: CTABL, no wheezes, normal WOB Abd: +BS, soft, NTND. no guarding or organomegaly Ext: No edema, warm Neuro: Alert and oriented, strength equal b/l UE and LE, coordination grossly normal MSK: normal muscle bulk     Assessment &  Plan:    Samantha Olson was seen today for cough and nasal congestion, likely started with acute URI, now bronchitis with worsening of symptoms. Will treat with azithromycin. Discussed symptomatic care.  Diagnoses and all orders for this visit:  Acute bronchitis, unspecified organism -     azithromycin (ZITHROMAX) 250 MG tablet; Take 2 the first day and then one each day after.  Cough -     guaiFENesin-codeine 100-10 MG/5ML syrup; Take 5 mLs by mouth at bedtime as needed for cough.  Adjustment disorder with depressed mood Hasnt yet started paxil.  Planning on starting it Had too much goin gon over the holidays    Follow up plan: Return if symptoms worsen or fail to improve.  [name], MD [facility] 10/21/2015, 4:41 PM

## 2015-10-29 ENCOUNTER — Institutional Professional Consult (permissible substitution): Payer: Self-pay | Admitting: Pulmonary Disease

## 2015-11-13 ENCOUNTER — Institutional Professional Consult (permissible substitution): Payer: Self-pay | Admitting: Pulmonary Disease

## 2015-11-13 ENCOUNTER — Ambulatory Visit: Payer: 59 | Admitting: Pediatrics

## 2015-12-12 ENCOUNTER — Telehealth: Payer: Self-pay | Admitting: Pediatrics

## 2016-01-01 ENCOUNTER — Ambulatory Visit (INDEPENDENT_AMBULATORY_CARE_PROVIDER_SITE_OTHER): Payer: 59 | Admitting: Pediatrics

## 2016-01-01 ENCOUNTER — Encounter: Payer: Self-pay | Admitting: Pediatrics

## 2016-01-01 VITALS — BP 130/81 | HR 69 | Temp 97.1°F | Ht 69.0 in | Wt 206.8 lb

## 2016-01-01 DIAGNOSIS — Z0000 Encounter for general adult medical examination without abnormal findings: Secondary | ICD-10-CM

## 2016-01-01 DIAGNOSIS — I10 Essential (primary) hypertension: Secondary | ICD-10-CM

## 2016-01-01 DIAGNOSIS — Z1211 Encounter for screening for malignant neoplasm of colon: Secondary | ICD-10-CM

## 2016-01-01 DIAGNOSIS — Z01419 Encounter for gynecological examination (general) (routine) without abnormal findings: Secondary | ICD-10-CM | POA: Diagnosis not present

## 2016-01-01 DIAGNOSIS — Z124 Encounter for screening for malignant neoplasm of cervix: Secondary | ICD-10-CM

## 2016-01-01 NOTE — Progress Notes (Signed)
Subjective:    Patient ID: Samantha Olson, female    DOB: [birth year], 65 y.o.   MRN: [id number]  CC: Gynecologic Exam well exam  HPI: Samantha Olson is a 65 y.o. female presenting for Gynecologic Exam  Stress: couldn't take celexa gave her the shakes, was on zoloft before that. Still traveling back and forth from [place] to take care of 75yo mother who lives a lone up there.  Pt with "the shakes" in her R thumb in the evening. Sometimes her daughter has noticed the shaking. Not every day. Not with intention. Mother had what sounds like essential tremor.  HTN: 130s/70s in the morning at home. Checked at night when she felt a little funny, was 100/55, rechecked up to [id number] systolic. Hasnt happened since. Not drinking much fluid in the winter.  Pap smear: has had abnls requiring LEEP  UTD on mammograms   Depression screen [facility] 2/9 01/01/2016 10/21/2015 09/23/2015 01/22/2014  Decreased Interest 0 0 3 0  Down, Depressed, Hopeless 0 - 2 0  PHQ - 2 Score 0 0 5 0  Altered sleeping - - 3 -  Tired, decreased energy - - 3 -  Change in appetite - - 0 -  Feeling bad or failure about yourself  - - 0 -  Trouble concentrating - - 2 -  Moving slowly or fidgety/restless - - 1 -  Suicidal thoughts - - 0 -  PHQ-9 Score - - 14 -     Relevant past medical, surgical, family and social history reviewed and updated as indicated. Interim medical history since our last visit reviewed. Allergies and medications reviewed and updated.    ROS: All systems negative other than what is in HPI  History  Smoking status  . Never Smoker   Smokeless tobacco  . Not on file    Past Medical History Patient Active Problem List   Diagnosis Date Noted  . HTN (hypertension) 03/05/2011  . Arthritis 03/05/2011  . GERD (gastroesophageal reflux disease) 03/05/2011  . Hiatal hernia 03/05/2011  . Hyperlipidemia 03/05/2011  . Vitamin D deficiency 03/05/2011  . Renal cyst 03/05/2011    Current Outpatient Prescriptions    Medication Sig Dispense Refill  . celecoxib (CELEBREX) 200 MG capsule     . Cholecalciferol (VITAMIN D) 2000 UNITS CAPS Take by mouth. 1 daily      . hydrochlorothiazide (HYDRODIURIL) 25 MG tablet Take 1 tablet (25 mg total) by mouth daily. 30 tablet 5  . losartan (COZAAR) 50 MG tablet Take 1 tablet (50 mg total) by mouth daily. 30 tablet 5   No current facility-administered medications for this visit.       Objective:    BP 130/81 mmHg  Pulse 69  Temp(Src) 97.1 F (36.2 C) (Oral)  Ht 5\' 9"  (1.753 m)  Wt 206 lb 12.8 oz (93.804 kg)  BMI 30.53 kg/m2  Wt Readings from Last 3 Encounters:  01/01/16 206 lb 12.8 oz (93.804 kg)  10/21/15 207 lb 6.4 oz (94.076 kg)  10/15/15 207 lb 11.2 oz (94.212 kg)     Gen: NAD, alert, cooperative with exam, NCAT EYES: EOMI, no scleral injection or icterus ENT:   OP without erythema LYMPH: no cervical LAD CV: NRRR, normal S1/S2, no murmur, distal pulses 2+ b/l Resp: CTABL, no wheezes, normal WOB Abd: +BS, soft, NTND. no guarding or organomegaly Ext: No edema, warm Neuro: Alert and oriented, strength equal b/l UE and LE, coordination grossly normal, normal finger nose finger, no tremor  in outstretched hands MSK: normal muscle bulk GU: normal ext female genitalia, normal appearing cervix, slightly friable     Assessment & Plan:    [name] was seen today for preventive exam.  Diagnoses and all orders for this visit:  Encounter for preventive health examination  Screen for colon cancer -     Fecal occult blood, imunochemical; Future  Screening for cervical cancer -     Pap IG and HPV (high risk) DNA detection  Essential hypertension Continue to check BPs at home. Had one low reading, usually in the [id number] systolic. Let me know if regularly elevated.   Follow up plan: Return in about 6 months (around 07/03/2016) for f/u blood pressure.  [name], MD [facility] 01/01/2016, 3:59 PM

## 2016-01-04 LAB — PAP IG AND HPV HIGH-RISK
HPV, high-risk: NEGATIVE
PAP Smear Comment: 0

## 2016-04-20 ENCOUNTER — Other Ambulatory Visit: Payer: Self-pay | Admitting: Pediatrics

## 2016-04-27 ENCOUNTER — Encounter: Payer: Self-pay | Admitting: *Deleted

## 2016-07-03 ENCOUNTER — Ambulatory Visit: Payer: 59 | Admitting: Pediatrics

## 2016-07-13 ENCOUNTER — Ambulatory Visit: Payer: 59 | Admitting: Pediatrics

## 2016-07-27 ENCOUNTER — Telehealth: Payer: Self-pay | Admitting: Pediatrics

## 2016-07-27 MED ORDER — HYDROCHLOROTHIAZIDE 25 MG PO TABS: 25.0000 mg | ORAL_TABLET | Freq: Every day | ORAL | 0 refills | Status: DC

## 2016-07-27 MED ORDER — LOSARTAN POTASSIUM 50 MG PO TABS: 50.0000 mg | ORAL_TABLET | Freq: Every day | ORAL | 0 refills | Status: DC

## 2016-07-27 NOTE — Telephone Encounter (Signed)
Due to pt's family emergency out of [place] refill of BP meds sent to [facility].

## 2016-08-31 ENCOUNTER — Other Ambulatory Visit: Payer: Self-pay | Admitting: Pediatrics

## 2016-09-30 ENCOUNTER — Other Ambulatory Visit: Payer: Self-pay | Admitting: Pediatrics

## 2016-09-30 MED ORDER — HYDROCHLOROTHIAZIDE 25 MG PO TABS: 25.0000 mg | ORAL_TABLET | Freq: Every day | ORAL | 0 refills | Status: DC

## 2016-09-30 MED ORDER — LOSARTAN POTASSIUM 50 MG PO TABS: 50.0000 mg | ORAL_TABLET | Freq: Every day | ORAL | 0 refills | Status: DC

## 2016-09-30 NOTE — Telephone Encounter (Signed)
done

## 2016-10-21 ENCOUNTER — Ambulatory Visit (INDEPENDENT_AMBULATORY_CARE_PROVIDER_SITE_OTHER): Payer: 59 | Admitting: Pediatrics

## 2016-10-21 ENCOUNTER — Encounter: Payer: Self-pay | Admitting: Pediatrics

## 2016-10-21 VITALS — BP 103/70 | HR 78 | Temp 97.2°F | Ht 69.0 in | Wt 208.8 lb

## 2016-10-21 DIAGNOSIS — Z1159 Encounter for screening for other viral diseases: Secondary | ICD-10-CM | POA: Diagnosis not present

## 2016-10-21 DIAGNOSIS — M545 Low back pain: Secondary | ICD-10-CM | POA: Diagnosis not present

## 2016-10-21 DIAGNOSIS — M5489 Other dorsalgia: Secondary | ICD-10-CM | POA: Insufficient documentation

## 2016-10-21 DIAGNOSIS — R739 Hyperglycemia, unspecified: Secondary | ICD-10-CM | POA: Diagnosis not present

## 2016-10-21 DIAGNOSIS — I10 Essential (primary) hypertension: Secondary | ICD-10-CM | POA: Diagnosis not present

## 2016-10-21 DIAGNOSIS — Z23 Encounter for immunization: Secondary | ICD-10-CM | POA: Diagnosis not present

## 2016-10-21 DIAGNOSIS — M549 Dorsalgia, unspecified: Secondary | ICD-10-CM | POA: Insufficient documentation

## 2016-10-21 LAB — BAYER DCA HB A1C WAIVED: HB A1C (BAYER DCA - WAIVED): 6.1 % (ref <7.0)

## 2016-10-21 MED ORDER — LOSARTAN POTASSIUM 50 MG PO TABS: 50.0000 mg | ORAL_TABLET | Freq: Every day | ORAL | 1 refills | Status: DC

## 2016-10-21 MED ORDER — HYDROCHLOROTHIAZIDE 12.5 MG PO TABS: 12.5000 mg | ORAL_TABLET | Freq: Every day | ORAL | 1 refills | Status: DC

## 2016-10-21 MED ORDER — CYCLOBENZAPRINE HCL 5 MG PO TABS: 5.0000 mg | ORAL_TABLET | Freq: Three times a day (TID) | ORAL | 1 refills | Status: DC | PRN

## 2016-10-21 NOTE — Patient Instructions (Addendum)
Netipot with distilled water 2-3 times a day to clear out sinuses Or Normal saline nasal spray Flonase steroid nasal spray Antihistamine daily such as cetirizine Lots of fluids   Back Exercises Introduction If you have pain in your back, do these exercises 2-3 times each day or as told by your doctor. When the pain goes away, do the exercises once each day, but repeat the steps more times for each exercise (do more repetitions). If you do not have pain in your back, do these exercises once each day or as told by your doctor. Exercises Single Knee to Chest  Do these steps 3-5 times in a row for each leg: 1. Lie on your back on a firm bed or the floor with your legs stretched out. 2. Bring one knee to your chest. 3. Hold your knee to your chest by grabbing your knee or thigh. 4. Pull on your knee until you feel a gentle stretch in your lower back. 5. Keep doing the stretch for 10-30 seconds. 6. Slowly let go of your leg and straighten it. Pelvic Tilt  Do these steps 5-10 times in a row: 1. Lie on your back on a firm bed or the floor with your legs stretched out. 2. Bend your knees so they point up to the ceiling. Your feet should be flat on the floor. 3. Tighten your lower belly (abdomen) muscles to press your lower back against the floor. This will make your tailbone point up to the ceiling instead of pointing down to your feet or the floor. 4. Stay in this position for 5-10 seconds while you gently tighten your muscles and breathe evenly. Cat-Cow  Do these steps until your lower back bends more easily: 1. Get on your hands and knees on a firm surface. Keep your hands under your shoulders, and keep your knees under your hips. You may put padding under your knees. 2. Let your head hang down, and make your tailbone point down to the floor so your lower back is round like the back of a cat. 3. Stay in this position for 5 seconds. 4. Slowly lift your head and make your tailbone point up to  the ceiling so your back hangs low (sags) like the back of a cow. 5. Stay in this position for 5 seconds. Press-Ups  Do these steps 5-10 times in a row: 1. Lie on your belly (face-down) on the floor. 2. Place your hands near your head, about shoulder-width apart. 3. While you keep your back relaxed and keep your hips on the floor, slowly straighten your arms to raise the top half of your body and lift your shoulders. Do not use your back muscles. To make yourself more comfortable, you may change where you place your hands. 4. Stay in this position for 5 seconds. 5. Slowly return to lying flat on the floor. Bridges  Do these steps 10 times in a row: 1. Lie on your back on a firm surface. 2. Bend your knees so they point up to the ceiling. Your feet should be flat on the floor. 3. Tighten your butt muscles and lift your butt off of the floor until your waist is almost as high as your knees. If you do not feel the muscles working in your butt and the back of your thighs, slide your feet 1-2 inches farther away from your butt. 4. Stay in this position for 3-5 seconds. 5. Slowly lower your butt to the floor, and let your butt  muscles relax. If this exercise is too easy, try doing it with your arms crossed over your chest. Belly Crunches  Do these steps 5-10 times in a row: 1. Lie on your back on a firm bed or the floor with your legs stretched out. 2. Bend your knees so they point up to the ceiling. Your feet should be flat on the floor. 3. Cross your arms over your chest. 4. Tip your chin a little bit toward your chest but do not bend your neck. 5. Tighten your belly muscles and slowly raise your chest just enough to lift your shoulder blades a tiny bit off of the floor. 6. Slowly lower your chest and your head to the floor. Back Lifts  Do these steps 5-10 times in a row: 1. Lie on your belly (face-down) with your arms at your sides, and rest your forehead on the floor. 2. Tighten the muscles  in your legs and your butt. 3. Slowly lift your chest off of the floor while you keep your hips on the floor. Keep the back of your head in line with the curve in your back. Look at the floor while you do this. 4. Stay in this position for 3-5 seconds. 5. Slowly lower your chest and your face to the floor. Contact a doctor if:  Your back pain gets a lot worse when you do an exercise.  Your back pain does not lessen 2 hours after you exercise. If you have any of these problems, stop doing the exercises. Do not do them again unless your doctor says it is okay. Get help right away if:  You have sudden, very bad back pain. If this happens, stop doing the exercises. Do not do them again unless your doctor says it is okay. This information is not intended to replace advice given to you by your health care provider. Make sure you discuss any questions you have with your health care provider. Document Released: 10/31/2010 Document Revised: 03/05/2016 Document Reviewed: 11/22/2014  2017 Elsevier

## 2016-10-21 NOTE — Progress Notes (Signed)
  Subjective:   Patient ID: Samantha Olson, female    DOB: August 15, 1951, 66 y.o.   MRN: [id number] CC: Follow-up (Hypertension)  HPI: Samantha Olson is a 66 y.o. 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 Allergies and medications reviewed and updated. History  Smoking Status  . Never Smoker  Smokeless Tobacco  . Never Used   ROS: Per HPI   Objective:    BP 103/70   Pulse 78   Temp 97.2 F (36.2 C) (Oral)   Ht '5\' 9"'$  (1.753 m)   Wt 208 lb 12.8 oz (94.7 kg)   BMI 30.83 kg/m   Wt Readings from Last 3 Encounters:  10/21/16 208 lb 12.8 oz (94.7 kg)  01/01/16 206 lb 12.8 oz (93.8 kg)  10/21/15 207 lb 6.4 oz (94.1 kg)    Gen: NAD, alert, cooperative with exam, NCAT EYES: EOMI, no conjunctival injection, or no icterus ENT:  TMs dull gray b/l, OP without erythema CV: NRRR, normal S1/S2, no murmur, distal pulses 2+ b/l Resp: CTABL, no wheezes, normal WOB Ext: No edema, warm Neuro: Alert and oriented, strength equal b/l UE and LE, coordination grossly normal  Assessment & Plan:  Samantha Olson was seen today for follow-up multiple med problems.  Diagnoses and all orders for this visit:  Essential hypertension Low today, a couple episodes of lightheadedness at home decreas HCTZ to 12.'5mg'$  Cont to follow at home, let know if still having episodes and what highest Bps are, may be able to stop HCTZ Labs today -     losartan (COZAAR) 50 MG tablet; Take 1 tablet (50 mg total) by mouth daily. -     hydrochlorothiazide (HYDRODIURIL) 12.5 MG tablet; Take 1 tablet  (12.5 mg total) by mouth daily. -     CMP14+EGFR  Hyperglycemia -     Bayer DCA Hb A1c Waived  Low back pain, unspecified back pain laterality, unspecified chronicity, with sciatica presence unspecified Avoid exacerbating movements Gave gentle back exercises NSAIDs, rest Below prn -     cyclobenzaprine (FLEXERIL) 5 MG tablet; Take 1 tablet (5 mg total) by mouth 3 (three) times daily as needed for muscle spasms.  Need for hepatitis C screening test -     Hepatitis C antibody  Encounter for immunization -     Flu Vaccine QUAD 36+ mos IM  Acute URI Discussed symptomatic care  Follow up plan: Return in about 6 months (around 04/20/2017). sooner if needed. [name], MD [place]

## 2016-10-22 LAB — CMP14+EGFR
ALT: 41 IU/L — ABNORMAL HIGH (ref 0–32)
AST: 25 IU/L (ref 0–40)
Albumin/Globulin Ratio: 2 (ref 1.2–2.2)
Albumin: 4.3 g/dL (ref 3.6–4.8)
Alkaline Phosphatase: 72 IU/L (ref 39–117)
BUN/Creatinine Ratio: 20 (ref 12–28)
BUN: 17 mg/dL (ref 8–27)
Bilirubin Total: 0.7 mg/dL (ref 0.0–1.2)
CO2: 26 mmol/L (ref 18–29)
Calcium: 10.1 mg/dL (ref 8.7–10.3)
Chloride: 100 mmol/L (ref 96–106)
Creatinine, Ser: 0.86 mg/dL (ref 0.57–1.00)
GFR calc Af Amer: 83 mL/min/{1.73_m2} (ref >59)
GFR calc non Af Amer: 72 mL/min/{1.73_m2} (ref >59)
Globulin, Total: 2.1 g/dL (ref 1.5–4.5)
Glucose: 103 mg/dL — ABNORMAL HIGH (ref 65–99)
Potassium: 4.2 mmol/L (ref 3.5–5.2)
Sodium: 144 mmol/L (ref 134–144)
Total Protein: 6.4 g/dL (ref 6.0–8.5)

## 2016-10-22 LAB — HEPATITIS C ANTIBODY: Hep C Virus Ab: <0.1 s/co ratio (ref 0.0–0.9)

## 2017-03-19 ENCOUNTER — Ambulatory Visit (INDEPENDENT_AMBULATORY_CARE_PROVIDER_SITE_OTHER): Payer: 59 | Admitting: Pediatrics

## 2017-03-19 ENCOUNTER — Encounter: Payer: Self-pay | Admitting: Pediatrics

## 2017-03-19 VITALS — BP 132/81 | HR 72 | Temp 97.4°F | Ht 69.0 in | Wt 205.2 lb

## 2017-03-19 DIAGNOSIS — J069 Acute upper respiratory infection, unspecified: Secondary | ICD-10-CM

## 2017-03-19 DIAGNOSIS — J029 Acute pharyngitis, unspecified: Secondary | ICD-10-CM | POA: Diagnosis not present

## 2017-03-19 LAB — RAPID STREP SCREEN (MED CTR MEBANE ONLY): Strep Gp A Ag, IA W/Reflex: NEGATIVE

## 2017-03-19 LAB — CULTURE, GROUP A STREP

## 2017-03-19 NOTE — Progress Notes (Signed)
  Subjective:   Patient ID: Samantha Olson, female    DOB: Jun 09, 1951, 66 y.o.   MRN: [id number] CC: Sore Throat (3 days)  HPI: Samantha Olson is a 66 y.o. female presenting for Sore Throat (3 days)  Sore throat started 3 days ago Minimal coughing Some itchy eyes Taking claritin No fevers Appetite has been ok grandkids have had some URI symptoms  Relevant past medical, surgical, family and social history reviewed. Allergies and medications reviewed and updated. History  Smoking Status  . Never Smoker  Smokeless Tobacco  . Never Used   ROS: Per HPI   Objective:    BP 132/81   Pulse 72   Temp 97.4 F (36.3 C) (Oral)   Ht 5\' 9"  (1.753 m)   Wt 205 lb 3.2 oz (93.1 kg)   BMI 30.30 kg/m   Wt Readings from Last 3 Encounters:  03/19/17 205 lb 3.2 oz (93.1 kg)  10/21/16 208 lb 12.8 oz (94.7 kg)  01/01/16 206 lb 12.8 oz (93.8 kg)    Gen: NAD, alert, cooperative with exam, NCAT EYES: EOMI, no conjunctival injection, or no icterus ENT: L TM pink nl LR, R TM gray, nl LR, OP with erythema LYMPH: <1cm ant cervical LAD CV: NRRR, normal S1/S2, no murmur, distal pulses 2+ b/l Resp: CTABL, no wheezes, normal WOB Ext: No edema, warm Neuro: Alert and oriented  Assessment & Plan:  [name] was seen today for sore throat.  Diagnoses and all orders for this visit:  Acute URI Discussed symptom care, return precautions  Sore throat Rapid strep neg, f/u culture -     Culture, Group A Strep -     Rapid strep screen (not at [facility])  Other orders -     Culture, Group A Strep   Follow up plan: Return if symptoms worsen or fail to improve. [name], MD [place]

## 2017-03-19 NOTE — Patient Instructions (Signed)
Netipot with distilled water 2-3 times a day to clear out sinuses Or Normal saline nasal spray Flonase or similar steroid nasal spray Antihistamine daily such as cetirizine Ibuprofen and tylenol as needed Lots of fluids

## 2017-03-22 ENCOUNTER — Telehealth: Payer: Self-pay | Admitting: Pediatrics

## 2017-03-22 LAB — CULTURE, GROUP A STREP: Strep A Culture: NEGATIVE

## 2017-03-22 MED ORDER — DOXYCYCLINE HYCLATE 100 MG PO TABS: 100.0000 mg | ORAL_TABLET | Freq: Two times a day (BID) | ORAL | 0 refills | Status: DC

## 2017-03-22 NOTE — Telephone Encounter (Signed)
Pt still has a bad ST, has not developed fever She does have achys & pains, which she pulled ticks off 2-3 weeks ago Please advise

## 2017-03-23 MED ORDER — DOXYCYCLINE HYCLATE 100 MG PO TABS: 100.0000 mg | ORAL_TABLET | Freq: Two times a day (BID) | ORAL | 0 refills | Status: DC

## 2017-03-23 NOTE — Telephone Encounter (Signed)
Rx sent to the [facility] in [place] and pt is aware.

## 2017-05-04 ENCOUNTER — Other Ambulatory Visit: Payer: Self-pay | Admitting: Pediatrics

## 2017-05-04 DIAGNOSIS — I10 Essential (primary) hypertension: Secondary | ICD-10-CM

## 2017-05-24 ENCOUNTER — Encounter: Payer: Self-pay | Admitting: Pediatrics

## 2017-05-24 ENCOUNTER — Ambulatory Visit (INDEPENDENT_AMBULATORY_CARE_PROVIDER_SITE_OTHER): Payer: 59 | Admitting: Pediatrics

## 2017-05-24 ENCOUNTER — Ambulatory Visit (INDEPENDENT_AMBULATORY_CARE_PROVIDER_SITE_OTHER): Payer: 59

## 2017-05-24 VITALS — BP 126/82 | HR 73 | Temp 97.3°F | Ht 69.0 in | Wt 206.4 lb

## 2017-05-24 DIAGNOSIS — R079 Chest pain, unspecified: Secondary | ICD-10-CM

## 2017-05-24 DIAGNOSIS — J3089 Other allergic rhinitis: Secondary | ICD-10-CM | POA: Diagnosis not present

## 2017-05-24 DIAGNOSIS — K219 Gastro-esophageal reflux disease without esophagitis: Secondary | ICD-10-CM

## 2017-05-24 DIAGNOSIS — R7303 Prediabetes: Secondary | ICD-10-CM

## 2017-05-24 DIAGNOSIS — M545 Low back pain: Secondary | ICD-10-CM | POA: Diagnosis not present

## 2017-05-24 DIAGNOSIS — I10 Essential (primary) hypertension: Secondary | ICD-10-CM | POA: Diagnosis not present

## 2017-05-24 LAB — BAYER DCA HB A1C WAIVED: HB A1C (BAYER DCA - WAIVED): 6.4 % (ref <7.0)

## 2017-05-24 MED ORDER — CYCLOBENZAPRINE HCL 5 MG PO TABS: 5.0000 mg | ORAL_TABLET | Freq: Three times a day (TID) | ORAL | 1 refills | Status: DC | PRN

## 2017-05-24 MED ORDER — HYDROCHLOROTHIAZIDE 12.5 MG PO TABS: 12.5000 mg | ORAL_TABLET | Freq: Every day | ORAL | 1 refills | Status: DC

## 2017-05-24 MED ORDER — LOSARTAN POTASSIUM 50 MG PO TABS: 50.0000 mg | ORAL_TABLET | Freq: Every day | ORAL | 1 refills | Status: DC

## 2017-05-24 MED ORDER — RANITIDINE HCL 150 MG PO TABS: 150.0000 mg | ORAL_TABLET | Freq: Two times a day (BID) | ORAL | 2 refills | Status: DC

## 2017-05-24 NOTE — Progress Notes (Signed)
Subjective:   Patient ID: Samantha Olson, female    DOB: 04/26/1951, 66 y.o.   MRN: [id number] CC: Follow-up (6 month) multiple med problems. HPI: Samantha Olson is a 66 y.o. female presenting for Follow-up (6 month)  Started having pain in L lower side of chest/side of breast all day yesterday Almost went to the hospital bc of pain No pain now Lasted for hours Went away when she was sitting for a while, then came back for another few hours Taking bra off didn't help, heating pad didn't help Not crampy Sitting still mad eit worse Ibuprofen didn't help Woke up this morning and pain had resolved Pain was sometimes burning, sometimes sharp Was constant No fevers  Was sitting when pain started, had been lifting more than usual Has been working outside in the garden regularly No SOB, no nausea Heat of the day limits activities, not pain or fatigue  Had some burping yesterday  Has been having some arm and shoulder pain after working in garden  Feeling normal self today  Has had more sinus drainage last few days Takes antihistamine daily Not taking flonase recently  Back pain bothers her when she has been more active, will try flexeril, helps some  Relevant past medical, surgical, family and social history reviewed. Allergies and medications reviewed and updated. History  Smoking Status  . Never Smoker  Smokeless Tobacco  . Never Used   ROS: Per HPI   Objective:    BP 126/82   Pulse 73   Temp (!) 97.3 F (36.3 C) (Oral)   Ht '5\' 9"'$  (1.753 m)   Wt 206 lb 6.4 oz (93.6 kg)   BMI 30.48 kg/m   Wt Readings from Last 3 Encounters:  05/24/17 206 lb 6.4 oz (93.6 kg)  03/19/17 205 lb 3.2 oz (93.1 kg)  10/21/16 208 lb 12.8 oz (94.7 kg)    Gen: NAD, alert, cooperative with exam, NCAT EYES: EOMI, no conjunctival injection, or no icterus ENT:  TMs pearly gray b/l, OP without erythema LYMPH: no cervical LAD CV: NRRR, normal S1/S2, no murmur, distal pulses 2+ b/l Resp: CTABL,  no wheezes, normal WOB Abd: +BS, soft, NTND. no guarding or organomegaly Ext: No edema, warm Neuro: Alert and oriented, strength equal b/l UE and LE, coordination grossly normal MSK: no pain with palpation along ribs, sternal margins Breast: nl L breast exam Skin: no rash, redness, induration on back, breast  EKG in NSR, no qwaves, twave inversions  Assessment & Plan:  Shevonne was seen today for follow-up med problems  Diagnoses and all orders for this visit:  Chest pain, unspecified type Atypical, not reproducible, not similar to prior GERD pain Risk factors for CAD include HTN Will check labs, CXR Refer to cardiology for CAD evaluation -     BMP8+EGFR -     CBC with Differential/Platelet -     DG Chest 2 View; Future -     Ambulatory referral to Cardiology -     EKG 12-Lead  Essential hypertension Adequate control, cont below -     losartan (COZAAR) 50 MG tablet; Take 1 tablet (50 mg total) by mouth daily. -     hydrochlorothiazide (HYDRODIURIL) 12.5 MG tablet; Take 1 tablet (12.5 mg total) by mouth daily. -     EKG 12-Lead  Pre-diabetes A1c up to 6.4 Cont wt loss, lifestyle changes -     Bayer DCA Hb A1c Waived  Allergic rhinitis due to other allergic trigger, unspecified seasonality Takes  antihistamine daily Start flonase  Low back pain, unspecified back pain laterality, unspecified chronicity, with sciatica presence unspecified Take below prn -     cyclobenzaprine (FLEXERIL) 5 MG tablet; Take 1 tablet (5 mg total) by mouth 3 (three) times daily as needed for muscle spasms.  Gastroesophageal reflux disease, esophagitis presence not specified Not taking anything regularly now, start below Does have reflux symptoms at time, different than chest pain above which she has never had before -     ranitidine (RANITIDINE 150 MAX STRENGTH) 150 MG tablet; Take 1 tablet (150 mg total) by mouth 2 (two) times daily.   Follow up plan: Return in about 2 months (around  07/24/2017). [name], MD [place]

## 2017-05-25 LAB — BMP8+EGFR
BUN/Creatinine Ratio: 15 (ref 12–28)
BUN: 18 mg/dL (ref 8–27)
CO2: 25 mmol/L (ref 20–29)
Calcium: 9.9 mg/dL (ref 8.7–10.3)
Chloride: 105 mmol/L (ref 96–106)
Creatinine, Ser: 1.19 mg/dL — ABNORMAL HIGH (ref 0.57–1.00)
GFR calc Af Amer: 55 mL/min/{1.73_m2} — ABNORMAL LOW (ref >59)
GFR calc non Af Amer: 48 mL/min/{1.73_m2} — ABNORMAL LOW (ref >59)
Glucose: 93 mg/dL (ref 65–99)
Potassium: 4.8 mmol/L (ref 3.5–5.2)
Sodium: 144 mmol/L (ref 134–144)

## 2017-05-25 LAB — CBC WITH DIFFERENTIAL/PLATELET
Basophils Absolute: 0.1 10*3/uL (ref 0.0–0.2)
Basos: 1 %
EOS (ABSOLUTE): 0.3 10*3/uL (ref 0.0–0.4)
Eos: 4 %
Hematocrit: 39.9 % (ref 34.0–46.6)
Hemoglobin: 13.6 g/dL (ref 11.1–15.9)
Immature Grans (Abs): 0 10*3/uL (ref 0.0–0.1)
Immature Granulocytes: 0 %
Lymphocytes Absolute: 2.4 10*3/uL (ref 0.7–3.1)
Lymphs: 30 %
MCH: 29.4 pg (ref 26.6–33.0)
MCHC: 34.1 g/dL (ref 31.5–35.7)
MCV: 86 fL (ref 79–97)
Monocytes Absolute: 0.4 10*3/uL (ref 0.1–0.9)
Monocytes: 5 %
Neutrophils Absolute: 4.8 10*3/uL (ref 1.4–7.0)
Neutrophils: 60 %
Platelets: 280 10*3/uL (ref 150–379)
RBC: 4.63 x10E6/uL (ref 3.77–5.28)
RDW: 14.3 % (ref 12.3–15.4)
WBC: 8 10*3/uL (ref 3.4–10.8)

## 2017-06-22 ENCOUNTER — Ambulatory Visit: Payer: Self-pay | Admitting: Cardiology

## 2017-06-25 ENCOUNTER — Encounter: Payer: Self-pay | Admitting: Cardiology

## 2017-06-25 NOTE — Progress Notes (Deleted)
    Referring-Carol Esther Hardy M.D. Reason for referral-chest pain  HPI: 66 year old female for evaluation of chest pain at request of Eustaquio Maize M.D. Chest x-ray August 2018 negative. Laboratories with hemoglobin 13.6 and potassium 4.8. BUN 18 and creatinine 1.19.   Current Outpatient Prescriptions  Medication Sig Dispense Refill  . Cholecalciferol (VITAMIN D) 2000 UNITS CAPS Take by mouth. 1 daily      . cyclobenzaprine (FLEXERIL) 5 MG tablet Take 1 tablet (5 mg total) by mouth 3 (three) times daily as needed for muscle spasms. 30 tablet 1  . hydrochlorothiazide (HYDRODIURIL) 12.5 MG tablet Take 1 tablet (12.5 mg total) by mouth daily. 90 tablet 1  . losartan (COZAAR) 50 MG tablet Take 1 tablet (50 mg total) by mouth daily. 90 tablet 1  . ranitidine (RANITIDINE 150 MAX STRENGTH) 150 MG tablet Take 1 tablet (150 mg total) by mouth 2 (two) times daily. 60 tablet 2   No current facility-administered medications for this visit.     Allergies  Allergen Reactions  . Ace Inhibitors Cough  . Celexa [Citalopram Hydrobromide]     Shakes- chest and arms  . Penicillins Swelling    Past Medical History:  Diagnosis Date  . Cataract   . Chronic kidney disease 1977   hx ofbladder tumor and cyst on kidney  . GERD (gastroesophageal reflux disease)   . H. pylori infection   . Hiatal hernia   . Hyperlipidemia   . Hypertension   . Vitamin D deficiency     Past Surgical History:  Procedure Laterality Date  . Bladder tumor removed    . JOINT REPLACEMENT Left 9/13   knee  . skin ca removal      Social History   Social History  . Marital status: Married    Spouse name: N/A  . Number of children: N/A  . Years of education: N/A   Occupational History  . Not on file.   Social History Main Topics  . Smoking status: Never Smoker  . Smokeless tobacco: Never Used  . Alcohol use Yes     Comment: rare use  . Drug use: No  . Sexual activity: Not on file   Other Topics Concern    . Not on file   Social History Narrative  . No narrative on file    Family History  Problem Relation Age of Onset  . Cancer Father        stomach cancer  . Hypertension Mother   . Hyperlipidemia Mother   . Stroke Maternal Grandmother   . Hypertension Maternal Grandmother     ROS: no fevers or chills, productive cough, hemoptysis, dysphasia, odynophagia, melena, hematochezia, dysuria, hematuria, rash, seizure activity, orthopnea, PND, pedal edema, claudication. Remaining systems are negative.  Physical Exam:   There were no vitals taken for this visit.  General:  Well developed/well nourished in NAD Skin warm/dry Patient not depressed No peripheral clubbing Back-normal HEENT-normal/normal eyelids Neck supple/normal carotid upstroke bilaterally; no bruits; no JVD; no thyromegaly chest - CTA/ normal expansion CV - RRR/normal S1 and S2; no murmurs, rubs or gallops;  PMI nondisplaced Abdomen -NT/ND, no HSM, no mass, + bowel sounds, no bruit 2+ femoral pulses, no bruits Ext-no edema, chords, 2+ DP Neuro-grossly nonfocal  ECG - 05/24/2017-sinus rhythm with no ST changes. personally reviewed  A/P  1  Kirk Ruths, MD

## 2017-07-07 ENCOUNTER — Ambulatory Visit: Payer: Self-pay | Admitting: Cardiology

## 2017-07-28 ENCOUNTER — Ambulatory Visit (INDEPENDENT_AMBULATORY_CARE_PROVIDER_SITE_OTHER): Payer: 59 | Admitting: Pediatrics

## 2017-07-28 ENCOUNTER — Encounter: Payer: Self-pay | Admitting: Pediatrics

## 2017-07-28 VITALS — BP 133/80 | HR 70 | Temp 97.4°F | Ht 69.0 in | Wt 208.4 lb

## 2017-07-28 DIAGNOSIS — Z23 Encounter for immunization: Secondary | ICD-10-CM | POA: Diagnosis not present

## 2017-07-28 DIAGNOSIS — I10 Essential (primary) hypertension: Secondary | ICD-10-CM

## 2017-07-28 DIAGNOSIS — F339 Major depressive disorder, recurrent, unspecified: Secondary | ICD-10-CM

## 2017-07-28 MED ORDER — SERTRALINE HCL 25 MG PO TABS: 25.0000 mg | ORAL_TABLET | Freq: Every day | ORAL | 3 refills | Status: DC

## 2017-07-28 NOTE — Patient Instructions (Addendum)
[email or web address]  Your provider wants you to schedule an appointment with a Psychologist/Psychiatrist. The following list of offices requires the patient to call and make their own appointment, as there is information they need that only you can provide. Please feel free to choose form the following providers:  [facility]   [phone] Crisis Recovery in [place] [phone]  [facility]  [phone]   [address] [place], [place]  (Scheduled through [facility]) Must call and do an interview for appointment. Sees Children / Accepts Medicaid  [facility]    [phone]  [address], [address]    [place], [place]       [facility]  [phone] [address] [place], [place]  Evaluates for Autism but does not treat it Sees Children / Accepts Medicaid  [facility]    [phone] [address], [address]   [place], [place] Medication management, substance abuse, bipolar, grief, family, marriage, OCD, anxiety, PTSD Sees children / Accepts Medicaid  [facility]    [phone] [address] [place], [place] Sees children / Accepts Medicaid  [facility]  [phone] [address] [place], [place]   Dr Akinlayo     [phone] [address] [place], [place]  Sees ADD & ADHD for treatment Accepts Medicaid  [facility]  [phone] [address] [place], [place] Evaluates for Autism Accepts Medicaid  [facility]  [phone] [address] [place], [place]  Does Adult ADD evaluations Does not accept Medicaid  [facility]   [phone] [address]   [place], [place] Uses animal therapy  Sees children as young as 3 years old Accepts Medicaid  [facility]     [phone]    [address]  , [place] [address] Sees children Accepts Medicaid   

## 2017-07-28 NOTE — Progress Notes (Signed)
Subjective:   Patient ID: Samantha Olson, female    DOB: [birth year], 66 y.o.   MRN: [id number] CC: Follow-up (2 month)  HPI: Samantha Olson is a 66 y.o. female presenting for Follow-up (2 month)  Mood has been down, feeling more overwhelmed Mother died in [place] Daughter with low blood cell counts, other daughter getting divorced, son recent overdose Now with new puppy, Hattie  Has had symptoms of depression in the past Not been on any medication  Trouble staying asleep Appetite has been ok Tired much of time Not interested in activities as she used to be No thoughts of self harm or not wanting to be here  No CP, SOB No Fevers Taking meds regularly  GAD 7 : Generalized Anxiety Score 07/28/2017  Nervous, Anxious, on Edge 1  Control/stop worrying 3  Worry too much - different things 3  Trouble relaxing 2  Restless 0  Easily annoyed or irritable 3  Afraid - awful might happen 3  Total GAD 7 Score 15    Depression screen [facility] 2/9 07/28/2017 05/24/2017 03/19/2017 10/21/2016 01/01/2016  Decreased Interest 3 1 0 0 0  Down, Depressed, Hopeless 1 1 0 0 0  PHQ - 2 Score 4 2 0 0 0  Altered sleeping 3 1 - - -  Tired, decreased energy 3 1 - - -  Change in appetite 3 0 - - -  Feeling bad or failure about yourself  1 0 - - -  Trouble concentrating 2 1 - - -  Moving slowly or fidgety/restless 0 0 - - -  Suicidal thoughts 0 0 - - -  PHQ-9 Score 16 5 - - -  Difficult doing work/chores Somewhat difficult Somewhat difficult - - -     Relevant past medical, surgical, family and social history reviewed. Allergies and medications reviewed and updated. History  Smoking Status  . Never Smoker  Smokeless Tobacco  . Never Used   ROS: Per HPI   Objective:    BP 133/80   Pulse 70   Temp (!) 97.4 F (36.3 C) (Oral)   Ht 5\' 9"  (1.753 m)   Wt 208 lb 6.4 oz (94.5 kg)   BMI 30.78 kg/m   Wt Readings from Last 3 Encounters:  07/28/17 208 lb 6.4 oz (94.5 kg)  05/24/17 206 lb 6.4 oz (93.6  kg)  03/19/17 205 lb 3.2 oz (93.1 kg)    Gen: NAD, alert, cooperative with exam, NCAT EYES: EOMI, no conjunctival injection, or no icterus ENT: OP without erythema LYMPH: no cervical LAD CV: NRRR, normal S1/S2, no murmur, distal pulses 2+ b/l Resp: CTABL, no wheezes, normal WOB Abd: +BS, soft, NTND. no guarding or organomegaly Ext: No edema, warm Neuro: Alert and oriented Psych: normal affect  Assessment & Plan:  [name] was seen today for depression  Diagnoses and all orders for this visit:  Depression, recurrent ([place]) New problem, start below Feels safe at home List of counselors given -     sertraline (ZOLOFT) 25 MG tablet; Take 1 tablet (25 mg total) by mouth daily. Take once a day in the morning for 2 weeks then take two tabs in the morning  Encounter for immunization -     Flu vaccine HIGH DOSE PF  Need for vaccination against Streptococcus pneumoniae using pneumococcal conjugate vaccine 7 -     Pneumococcal conjugate vaccine 13-valent  Essential hypertension Stable, cont current meds  Follow up plan: No Follow-up on file. [name], MD [facility]

## 2017-12-01 ENCOUNTER — Other Ambulatory Visit: Payer: Self-pay | Admitting: Pediatrics

## 2017-12-01 DIAGNOSIS — I10 Essential (primary) hypertension: Secondary | ICD-10-CM

## 2017-12-01 DIAGNOSIS — F339 Major depressive disorder, recurrent, unspecified: Secondary | ICD-10-CM

## 2017-12-01 DIAGNOSIS — M545 Low back pain: Secondary | ICD-10-CM

## 2017-12-24 ENCOUNTER — Other Ambulatory Visit: Payer: Self-pay | Admitting: Pediatrics

## 2017-12-24 ENCOUNTER — Telehealth: Payer: Self-pay | Admitting: *Deleted

## 2017-12-24 DIAGNOSIS — I10 Essential (primary) hypertension: Secondary | ICD-10-CM

## 2017-12-24 NOTE — Telephone Encounter (Signed)
Fax received [employer] [facility] alternative Losartan 50 mg tab on recall May new Rx for 100 mg be sent Please advise

## 2017-12-27 ENCOUNTER — Telehealth: Payer: Self-pay | Admitting: *Deleted

## 2017-12-27 ENCOUNTER — Telehealth: Payer: Self-pay | Admitting: Pediatrics

## 2017-12-27 DIAGNOSIS — I10 Essential (primary) hypertension: Secondary | ICD-10-CM

## 2017-12-27 MED ORDER — LOSARTAN POTASSIUM 100 MG PO TABS: 50.0000 mg | ORAL_TABLET | Freq: Every day | ORAL | 0 refills | Status: DC

## 2017-12-27 MED ORDER — OLMESARTAN MEDOXOMIL 20 MG PO TABS: 20.0000 mg | ORAL_TABLET | Freq: Every day | ORAL | 3 refills | Status: DC

## 2017-12-27 NOTE — Telephone Encounter (Signed)
Aware of medication change. 

## 2017-12-27 NOTE — Telephone Encounter (Signed)
olmesartan sent in

## 2017-12-27 NOTE — Telephone Encounter (Signed)
Losartan 50mg  is on backorder due to recall and Olmesartan 20mg  is on backorder because it has been ordered a lot in place of losartan. [facility] has Losartan 100mg  available and said that most people are switching to 1/2 tab of Losartan 100mg . Verbal permission given to switch for now. Change made in chart as well.

## 2018-01-12 ENCOUNTER — Encounter: Payer: Self-pay | Admitting: Pediatrics

## 2018-01-12 ENCOUNTER — Ambulatory Visit (INDEPENDENT_AMBULATORY_CARE_PROVIDER_SITE_OTHER): Payer: 59

## 2018-01-12 ENCOUNTER — Ambulatory Visit: Payer: 59 | Admitting: Pediatrics

## 2018-01-12 VITALS — BP 154/91 | HR 65 | Temp 97.1°F | Ht 69.0 in | Wt 201.0 lb

## 2018-01-12 DIAGNOSIS — R7303 Prediabetes: Secondary | ICD-10-CM

## 2018-01-12 DIAGNOSIS — M25552 Pain in left hip: Secondary | ICD-10-CM

## 2018-01-12 DIAGNOSIS — I10 Essential (primary) hypertension: Secondary | ICD-10-CM

## 2018-01-12 LAB — BMP8+EGFR
BUN/Creatinine Ratio: 23 (ref 12–28)
BUN: 20 mg/dL (ref 8–27)
CO2: 27 mmol/L (ref 20–29)
Calcium: 9.8 mg/dL (ref 8.7–10.3)
Chloride: 101 mmol/L (ref 96–106)
Creatinine, Ser: 0.86 mg/dL (ref 0.57–1.00)
GFR calc Af Amer: 82 mL/min/{1.73_m2} (ref >59)
GFR calc non Af Amer: 71 mL/min/{1.73_m2} (ref >59)
Glucose: 110 mg/dL — ABNORMAL HIGH (ref 65–99)
Potassium: 4.5 mmol/L (ref 3.5–5.2)
Sodium: 142 mmol/L (ref 134–144)

## 2018-01-12 LAB — BAYER DCA HB A1C WAIVED: HB A1C (BAYER DCA - WAIVED): 5.9 % (ref <7.0)

## 2018-01-12 MED ORDER — LOSARTAN POTASSIUM 100 MG PO TABS: 100.0000 mg | ORAL_TABLET | Freq: Every day | ORAL | 1 refills | Status: DC

## 2018-01-12 NOTE — Progress Notes (Signed)
  Subjective:   Patient ID: Samantha Olson, female    DOB: January 10, 1951, 67 y.o.   MRN: [id number] CC: Hypertension  HPI: Samantha Olson is a 67 y.o. female presenting for Hypertension  HTN: Losartan 50 mg tabs she was on were recalled, off of the medicine systolic blood pressures elevated, up to 180s.  Felt off with elevated blood pressures.  Restarted on half tab of 100 mg tabs, since then blood pressures have remained elevated, into 360O-770H systolics.  Continues to take hydrochlorothiazide daily.  L hip pain: walking not bothering her as much as sitting.  Sometimes will feel pinching in her groin.  Sometimes will have muscle spasms posterior hip.  Flexeril does help some.  Prediabetes: Has been checking her morning blood sugars at times, was in 120s, for the last week up into the 140s.  Trying to avoid sugary foods.  Relevant past medical, surgical, family and social history reviewed. Allergies and medications reviewed and updated. Social History   Tobacco Use  Smoking Status Never Smoker  Smokeless Tobacco Never Used   ROS: Per HPI   Objective:    BP (!) 154/91   Pulse 65   Temp (!) 97.1 F (36.2 C) (Oral)   Ht '5\' 9"'$  (1.753 m)   Wt 201 lb (91.2 kg)   BMI 29.68 kg/m   Wt Readings from Last 3 Encounters:  01/12/18 201 lb (91.2 kg)  07/28/17 208 lb 6.4 oz (94.5 kg)  05/24/17 206 lb 6.4 oz (93.6 kg)    Gen: NAD, alert, cooperative with exam, NCAT EYES: EOMI, no conjunctival injection, or no icterus CV: NRRR, normal S1/S2, no murmur, distal pulses 2+ b/l Resp: CTABL, no wheezes, normal WOB Ext: No edema, warm Neuro: Alert and oriented, strength equal b/l UE and LE, coordination grossly normal MSK: Left hip with decreased internal and external rotation compared with right hip.  Negative straight leg raise bilateral.  Assessment & Plan:  Samantha Olson was seen today for hypertension.  Diagnoses and all orders for this visit:  Pre-diabetes Continue to substitute lower sugar  alternatives -     Bayer DCA Hb A1c Waived  Left hip pain -     DG HIP UNILAT W OR W/O PELVIS 2-3 VIEWS LEFT; Future  Essential hypertension Elevated today.  Increase losartan to 100 mg daily.  Labs today.  Send me a message 1-2 weeks with blood pressures at home.  -     BMP8+EGFR -     losartan (COZAAR) 100 MG tablet; Take 1 tablet (100 mg total) by mouth daily.   Follow up plan: Return in about 3 months (around 04/13/2018). [name], MD [place]

## 2018-04-18 ENCOUNTER — Encounter: Payer: Self-pay | Admitting: Pediatrics

## 2018-04-18 ENCOUNTER — Ambulatory Visit: Payer: 59 | Admitting: Pediatrics

## 2018-04-18 VITALS — BP 133/84 | HR 65 | Temp 97.3°F | Ht 69.0 in | Wt 198.0 lb

## 2018-04-18 DIAGNOSIS — F339 Major depressive disorder, recurrent, unspecified: Secondary | ICD-10-CM

## 2018-04-18 DIAGNOSIS — M545 Low back pain: Secondary | ICD-10-CM | POA: Diagnosis not present

## 2018-04-18 DIAGNOSIS — I10 Essential (primary) hypertension: Secondary | ICD-10-CM | POA: Diagnosis not present

## 2018-04-18 MED ORDER — LOSARTAN POTASSIUM 100 MG PO TABS: 100.0000 mg | ORAL_TABLET | Freq: Every day | ORAL | 0 refills | Status: DC

## 2018-04-18 MED ORDER — CYCLOBENZAPRINE HCL 5 MG PO TABS: 5.0000 mg | ORAL_TABLET | Freq: Three times a day (TID) | ORAL | 3 refills | Status: DC | PRN

## 2018-04-18 MED ORDER — SERTRALINE HCL 50 MG PO TABS: 50.0000 mg | ORAL_TABLET | Freq: Every day | ORAL | 1 refills | Status: DC

## 2018-04-18 NOTE — Progress Notes (Signed)
  Subjective:   Patient ID: Samantha Olson, female    DOB: Jan 21, 1951, 67 y.o.   MRN: [id number] CC: Medical Management of Chronic Issues  HPI: Samantha Olson is a 67 y.o. female   Hip tightness: Points to left with density.  Gardening is her stress relief.  Does a lot of wheelbarrow in.  After being very active she notes pain more.  Takes Flexeril at night when needed.  Couple days a week usually.  Depression: Daughter having open heart surgery next month.  Taking sertraline daily.  Helps some always.  If she can garden every day her stress is much better.  High blood pressure: Stopped taking hydrochlorthiazide because she started getting lightheaded when outside working the yard.  Blood pressures at home been 500 systolics.  Tightness in her chest, shortness of breath with exertion.  Walking dog regularly.  Relevant past medical, surgical, family and social history reviewed. Allergies and medications reviewed and updated. Social History   Tobacco Use  Smoking Status Never Smoker  Smokeless Tobacco Never Used   ROS: Per HPI   Objective:    BP 133/84   Pulse 65   Temp (!) 97.3 F (36.3 C) (Oral)   Ht 5\' 9"  (1.753 m)   Wt 198 lb (89.8 kg)   BMI 29.24 kg/m   Wt Readings from Last 3 Encounters:  04/18/18 198 lb (89.8 kg)  01/12/18 201 lb (91.2 kg)  07/28/17 208 lb 6.4 oz (94.5 kg)    Gen: NAD, alert, cooperative with exam, NCAT EYES: EOMI, no conjunctival injection, or no icterus ENT:  TMs pearly gray b/l, OP without erythema LYMPH: no cervical LAD CV: NRRR, normal S1/S2, no murmur, distal pulses 2+ b/l Resp: CTABL, no wheezes, normal WOB Ext: No edema, warm Neuro: Alert and oriented, strength equal b/l UE and LE, coordination grossly normal MSK: normal muscle bulk  Assessment & Plan:  Samantha Olson was seen today for medical management of chronic issues.  Diagnoses and all orders for this visit:  Essential hypertension Stable, continue below.  Stop hydrochlorothiazide, patient  has not been taking the last few weeks. -     losartan (COZAAR) 100 MG tablet; Take 1 tablet (100 mg total) by mouth daily.  Depression, recurrent ([place]) Stable, continue below.  Discussed could increase medicine patient doing stress relieving activities. -     sertraline (ZOLOFT) 50 MG tablet; Take 1 tablet (50 mg total) by mouth daily.  Low back pain, unspecified back pain laterality, unspecified chronicity, with sciatica presence unspecified Pain off and down to her left lower buttock pain.  Offered physical therapy.  Patient is going to think about it.  -     cyclobenzaprine (FLEXERIL) 5 MG tablet; Take 1 tablet (5 mg total) by mouth 3 (three) times daily as needed. for muscle spams   Follow up plan: Return in about 6 months (around 10/19/2018). [name], MD [place]

## 2018-05-19 ENCOUNTER — Encounter: Payer: Self-pay | Admitting: Pediatrics

## 2018-05-19 ENCOUNTER — Other Ambulatory Visit: Payer: Self-pay

## 2018-05-19 ENCOUNTER — Other Ambulatory Visit: Payer: Self-pay | Admitting: Pediatrics

## 2018-05-19 ENCOUNTER — Ambulatory Visit: Payer: 59 | Admitting: Pediatrics

## 2018-05-19 VITALS — BP 152/88 | HR 64 | Temp 97.2°F | Ht 69.0 in | Wt 198.4 lb

## 2018-05-19 DIAGNOSIS — I10 Essential (primary) hypertension: Secondary | ICD-10-CM

## 2018-05-19 DIAGNOSIS — J0100 Acute maxillary sinusitis, unspecified: Secondary | ICD-10-CM | POA: Diagnosis not present

## 2018-05-19 DIAGNOSIS — Z1239 Encounter for other screening for malignant neoplasm of breast: Secondary | ICD-10-CM

## 2018-05-19 MED ORDER — DOXYCYCLINE HYCLATE 100 MG PO TABS: 100.0000 mg | ORAL_TABLET | Freq: Two times a day (BID) | ORAL | 0 refills | Status: DC

## 2018-05-19 MED ORDER — AMLODIPINE BESYLATE 2.5 MG PO TABS: 2.5000 mg | ORAL_TABLET | Freq: Every day | ORAL | 1 refills | Status: DC

## 2018-05-19 NOTE — Patient Instructions (Signed)
Fever reducer and headache: tylenol and ibuprofen, can take together or alternating   Sinus pressure:  Nasal steroid such as flonase/fluticaone or nasocort daily Can also take daily antihistamine such as loratadine/claritin or cetirizine/zyrtec  Sinus rinses/irritation: Netipot or similar with distilled water 2-3 times a day to clear out sinuses or Normal saline nasal spray  Drink lots of fluids

## 2018-05-19 NOTE — Progress Notes (Signed)
  Subjective:   Patient ID: Samantha Olson, female    DOB: Nov 07, 1950, 67 y.o.   MRN: [id number] CC: Ear Pain; Jaw Pain; and Facial Pain  HPI: Samantha Olson is a 67 y.o. female   For the past 2-1/2 weeks she has had some URI symptoms, congestion, throat drainage.  Coughing some off and on.  For the last 3 to 4 days she had increasing amount of pain in the left side of her face, below her eye and over her eyebrow.  A few days ago she was also having left sided jaw pain.  The jaw pain has improved.  Hypertension: Taking losartan regularly.  No lightheadedness or dizziness.  No chest pain or chest pressure.  Blood pressures at home have been 140s to 150s over 70s up to 90s.  Relevant past medical, surgical, family and social history reviewed. Allergies and medications reviewed and updated. Social History   Tobacco Use  Smoking Status Never Smoker  Smokeless Tobacco Never Used   ROS: Per HPI   Objective:    BP (!) 152/88   Pulse 64   Temp (!) 97.2 F (36.2 C) (Oral)   Ht 5\' 9"  (1.753 m)   Wt 198 lb 6.4 oz (90 kg)   BMI 29.30 kg/m   Wt Readings from Last 3 Encounters:  05/19/18 198 lb 6.4 oz (90 kg)  04/18/18 198 lb (89.8 kg)  01/12/18 201 lb (91.2 kg)    Gen: NAD, alert, cooperative with exam, NCAT EYES: EOMI, no conjunctival injection, or no icterus ENT: Left TM pink, yellow effusion. OP without erythema, tender to palpation over left maxillary and frontal sinuses. LYMPH: no cervical LAD CV: NRRR, normal S1/S2, no murmur, distal pulses 2+ b/l Resp: CTABL, no wheezes, normal WOB Abd: +BS, soft, NTND. no guarding or organomegaly Ext: No edema, warm Neuro: Alert and oriented, strength equal b/l UE and LE, coordination grossly normal MSK: normal muscle bulk  Assessment & Plan:  Samantha Olson was seen today for ear pain, jaw pain and facial pain.  Diagnoses and all orders for this visit:  Acute non-recurrent maxillary sinusitis Discussed symptomatic care, sinus rinses, continue  Flonase, take daily antihistamine.  Start below. -     doxycycline (VIBRA-TABS) 100 MG tablet; Take 1 tablet (100 mg total) by mouth 2 (two) times daily.  Essential hypertension Persistently elevated at home.  Start below.  Let me know if remains elevated.  Continue losartan. -     amLODipine (NORVASC) 2.5 MG tablet; Take 1 tablet (2.5 mg total) by mouth daily.   Follow up plan: Return in about 3 months (around 08/19/2018). Samantha Found, MD [place]

## 2018-08-11 ENCOUNTER — Other Ambulatory Visit: Payer: Self-pay | Admitting: Pediatrics

## 2018-08-11 DIAGNOSIS — I10 Essential (primary) hypertension: Secondary | ICD-10-CM

## 2018-08-15 ENCOUNTER — Telehealth: Payer: Self-pay | Admitting: Pediatrics

## 2018-08-15 NOTE — Telephone Encounter (Signed)
Pt needs RF on HCTZ Pt did not start the Amlodipine in August d/t possible swelling She continued with HCTZ since she had refills on it She will not have any HCTZ for about 5 days before she sees you on 08/22/18

## 2018-08-16 MED ORDER — HYDROCHLOROTHIAZIDE 12.5 MG PO TABS: 12.5000 mg | ORAL_TABLET | Freq: Every day | ORAL | 0 refills | Status: DC

## 2018-08-16 NOTE — Telephone Encounter (Signed)
Patient aware and verbalizes understanding. 

## 2018-08-16 NOTE — Telephone Encounter (Signed)
lmtcb

## 2018-08-16 NOTE — Telephone Encounter (Signed)
Sent in. Check Bps at hoe, bring numbers and cuff if she has one to clinic

## 2018-08-22 ENCOUNTER — Ambulatory Visit: Payer: 59 | Admitting: Pediatrics

## 2018-08-22 ENCOUNTER — Ambulatory Visit (INDEPENDENT_AMBULATORY_CARE_PROVIDER_SITE_OTHER): Payer: 59

## 2018-08-22 ENCOUNTER — Encounter: Payer: Self-pay | Admitting: Pediatrics

## 2018-08-22 VITALS — BP 129/88 | HR 71 | Temp 97.7°F | Ht 69.0 in | Wt 201.8 lb

## 2018-08-22 DIAGNOSIS — I10 Essential (primary) hypertension: Secondary | ICD-10-CM

## 2018-08-22 DIAGNOSIS — Z780 Asymptomatic menopausal state: Secondary | ICD-10-CM | POA: Diagnosis not present

## 2018-08-22 DIAGNOSIS — M545 Low back pain, unspecified: Secondary | ICD-10-CM

## 2018-08-22 DIAGNOSIS — Z1211 Encounter for screening for malignant neoplasm of colon: Secondary | ICD-10-CM

## 2018-08-22 DIAGNOSIS — W19XXXA Unspecified fall, initial encounter: Secondary | ICD-10-CM

## 2018-08-22 DIAGNOSIS — F339 Major depressive disorder, recurrent, unspecified: Secondary | ICD-10-CM

## 2018-08-22 DIAGNOSIS — Z23 Encounter for immunization: Secondary | ICD-10-CM

## 2018-08-22 DIAGNOSIS — R2681 Unsteadiness on feet: Secondary | ICD-10-CM

## 2018-08-22 DIAGNOSIS — Z8249 Family history of ischemic heart disease and other diseases of the circulatory system: Secondary | ICD-10-CM

## 2018-08-22 MED ORDER — CYCLOBENZAPRINE HCL 5 MG PO TABS: 5.0000 mg | ORAL_TABLET | Freq: Three times a day (TID) | ORAL | 3 refills | Status: DC | PRN

## 2018-08-22 MED ORDER — SERTRALINE HCL 100 MG PO TABS: 100.0000 mg | ORAL_TABLET | Freq: Every day | ORAL | 1 refills | Status: DC

## 2018-08-22 MED ORDER — LOSARTAN POTASSIUM 100 MG PO TABS: 100.0000 mg | ORAL_TABLET | Freq: Every day | ORAL | 0 refills | Status: DC

## 2018-08-22 NOTE — Progress Notes (Signed)
  Subjective:   Patient ID: Samantha Olson, female    DOB: August 18, 1951, 67 y.o.   MRN: [id number] CC: Medical Management of Chronic Issues  HPI: Samantha Olson is a 67 y.o. female   Hypertension: Brought numbers from home, consistently 110s to 120s over 40s to 3s.  No lightheadedness or dizziness.  Taking medicine regularly.  She has had 2 recent falls, pushed over by her sheep.  She does feel slightly more unsteady on her feet than she used to.  Has not had bone density scan.   Depression: Mood is been okay, still feeling anxious.  Daughter doing better.  Daughter with history of aortic aneurysm.  Patient was seen by medical genetics, has same mutation the daughter has, recommended echo to evaluate her for aneurysm.  Low back pain: Flexeril helps when she needs it.  Relevant past medical, surgical, family and social history reviewed. Allergies and medications reviewed and updated. Social History   Tobacco Use  Smoking Status Never Smoker  Smokeless Tobacco Never Used   ROS: Per HPI   Objective:    BP 129/88   Pulse 71   Temp 97.7 F (36.5 C) (Oral)   Ht 5\' 9"  (1.753 m)   Wt 201 lb 12.8 oz (91.5 kg)   BMI 29.80 kg/m   Wt Readings from Last 3 Encounters:  08/22/18 201 lb 12.8 oz (91.5 kg)  05/19/18 198 lb 6.4 oz (90 kg)  04/18/18 198 lb (89.8 kg)    Gen: NAD, alert, cooperative with exam, NCAT EYES: EOMI, no conjunctival injection, or no icterus ENT:  OP without erythema LYMPH: no cervical LAD CV: NRRR, normal S1/S2, no murmur, distal pulses 2+ b/l Resp: CTABL, no wheezes, normal WOB Abd: +BS, soft, NTND. Ext: No edema, warm Neuro: Alert and oriented MSK: normal muscle bulk  Assessment & Plan:  Samantha Olson was seen today for medical management of chronic issues.  Diagnoses and all orders for this visit:  Depression, recurrent ([place]) Some ongoing symptoms, will increase sertraline -     sertraline (ZOLOFT) 100 MG tablet; Take 1 tablet (100 mg total) by mouth  daily.  Essential hypertension Stable, continue current medicines -     losartan (COZAAR) 100 MG tablet; Take 1 tablet (100 mg total) by mouth daily.  Low back pain Stable, continue below as needed -     cyclobenzaprine (FLEXERIL) 5 MG tablet; Take 1 tablet (5 mg total) by mouth 3 (three) times daily as needed. for muscle spams  Fall, initial encounter  Family history of aortic aneurysm -     ECHOCARDIOGRAM COMPLETE; Future  Post-menopausal -     DG [facility] DEXA  Gait instability -     Ambulatory referral to Physical Therapy  Colon cancer screening -     Cologuard  Encounter for immunization -     Flu vaccine HIGH DOSE PF  Other orders -     Pneumococcal polysaccharide vaccine 23-valent greater than or equal to 2yo subcutaneous/IM   Follow up plan: Return in about 3 months (around 11/22/2018). [name], MD [place]

## 2018-08-26 ENCOUNTER — Ambulatory Visit (HOSPITAL_COMMUNITY): Admission: RE | Admit: 2018-08-26 | Payer: Self-pay | Source: Ambulatory Visit

## 2018-08-29 ENCOUNTER — Other Ambulatory Visit: Payer: Self-pay

## 2018-08-29 ENCOUNTER — Ambulatory Visit: Payer: 59 | Attending: Pediatrics | Admitting: Physical Therapy

## 2018-08-29 ENCOUNTER — Encounter: Payer: Self-pay | Admitting: Physical Therapy

## 2018-08-29 DIAGNOSIS — R2681 Unsteadiness on feet: Secondary | ICD-10-CM

## 2018-08-29 DIAGNOSIS — M6281 Muscle weakness (generalized): Secondary | ICD-10-CM | POA: Diagnosis present

## 2018-08-29 NOTE — Addendum Note (Signed)
Addended by: Gabriela Eves on: 08/29/2018 09:00 PM   Modules accepted: Orders

## 2018-08-29 NOTE — Therapy (Addendum)
[facility] [place], [place], [address] Phone: [phone]   Fax:  [phone]  Physical Therapy Evaluation  Patient Details  Name: Samantha Olson MRN: [id number] Date of Birth: September 05, 1951 Referring Provider (PT): [name], MD   Encounter Date: 08/29/2018  PT End of Session - 08/29/18 2037    Visit Number  1    Number of Visits  12    Authorization Type  Progress note every 10th visit    PT Start Time  0946    PT Stop Time  1031    PT Time Calculation (min)  45 min    Activity Tolerance  Patient tolerated treatment well    Behavior During Therapy  [facility] for tasks assessed/performed       Past Medical History:  Diagnosis Date  . Cataract   . Chronic kidney disease 1977   hx ofbladder tumor and cyst on kidney  . GERD (gastroesophageal reflux disease)   . H. pylori infection   . Hiatal hernia   . Hyperlipidemia   . Hypertension   . Vitamin D deficiency     Past Surgical History:  Procedure Laterality Date  . Bladder tumor removed    . JOINT REPLACEMENT Left 9/13   knee  . skin ca removal      There were no vitals filed for this visit.   Subjective Assessment - 08/29/18 2033    Subjective  Patient arrives to physical therapy with reports of gait instability and increased falls. Patient reports falling twice in one week outdoors. Patient reports she was able to come to standing independently. Patient reports intermittent left hip pain and bilateral knee pain R>L secondary to the fall. She states she is able to perform all her ADLs independently. Patient reports she notices increased instability while gardening. 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  Yes    Pain Location  Hip    Pain Orientation  Left    Pain Descriptors / Indicators  Sore    Pain Type  Acute pain    Pain Onset  1 to 4 weeks ago    Pain Frequency  Intermittent    Aggravating Factors   lifting, gardening, stairs    Pain Relieving Factors  heat, advil    Effect of Pain on Daily Activities  unable to perform farm activities without resting         [facility] PT Assessment - 08/29/18 0001      Assessment   Medical Diagnosis  Gait instability    Referring Provider (PT)  [name], MD    Onset Date/Surgical Date  08/30/18   first fall; 2nd fall 08/28/18   Next MD Visit  March 2020    Prior Therapy  no      Precautions   Precautions  Fall      Restrictions   Weight Bearing Restrictions  No      Balance Screen   Has the patient fallen in the past 6 months  Yes    How many times?  2    Has the patient had a decrease in activity level because of a fear of falling?   No    Is the patient reluctant to leave their home because of a fear of  falling?   No      Home Social worker  Private residence    Living Arrangements  Spouse/significant other;Children      Prior Function   Level of Independence  Independent      ROM / Strength   AROM / PROM / Strength  AROM;Strength      AROM   Overall AROM   Within functional limits for tasks performed      Strength   Strength Assessment Site  Knee;Hip    Right/Left Hip  Right;Left    Right Hip Flexion  3+/5    Right Hip Extension  3+/5    Right Hip ABduction  3+/5    Left Hip Flexion  3+/5    Left Hip Extension  3+/5    Left Hip ABduction  3+/5    Right/Left Knee  Right;Left    Right Knee Flexion  4+/5    Right Knee Extension  5/5    Left Knee Flexion  4+/5    Left Knee Extension  5/5      Transfers   Transfers  Independent with all Transfers      Ambulation/Gait   Gait Pattern  Within Functional Limits      Standardized Balance Assessment   Standardized Balance Assessment  Berg Balance Test      Berg Balance Test   Sit to Stand  Able to  stand without using hands and stabilize independently    Standing Unsupported  Able to stand safely 2 minutes    Sitting with Back Unsupported but Feet Supported on Floor or Stool  Able to sit safely and securely 2 minutes    Stand to Sit  Sits safely with minimal use of hands    Transfers  Able to transfer safely, minor use of hands    Standing Unsupported with Eyes Closed  Able to stand 10 seconds safely    Standing Ubsupported with Feet Together  Able to place feet together independently and stand 1 minute safely    From Standing, Reach Forward with Outstretched Arm  Can reach confidently >25 cm (10")    From Standing Position, Pick up Object from Floor  Able to pick up shoe, needs supervision    From Standing Position, Turn to Look Behind Over each Shoulder  Looks behind one side only/other side shows less weight shift    Turn 360 Degrees  Able to turn 360 degrees safely in 4 seconds or less    Standing Unsupported, Alternately Place Feet on Step/Stool  Able to stand independently and safely and complete 8 steps in 20 seconds    Standing Unsupported, One Foot in Front  Able to take small step independently and hold 30 seconds    Standing on One Leg  Able to lift leg independently and hold 5-10 seconds    Total Score  51                Objective measurements completed on examination: See above findings.              PT Education - 08/29/18 2037    Education Details  Heel raises, toe raises, lateral stepping, hip adduction    Person(s) Educated  Patient    Methods  Explanation;Demonstration;Handout    Comprehension  Verbalized understanding          PT Long Term Goals - 08/29/18 2040      PT LONG TERM GOAL #1   Title  Patient  will be independent with HEP    Time  6    Period  Weeks    Status  New      PT LONG TERM GOAL #2   Title  Patient will demonsrate 56/56 on Berg Balance scale to decrease risk of falls.     Time  6    Period  Weeks    Status  New       PT LONG TERM GOAL #3   Title  Patient will demonstrate 4/5 or greater hip MMT bilaterally to improve stability during functional tasks.     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     Time  6    Period  Weeks    Status  New             Plan - 08/29/18 2038    Clinical Impression Statement  Patient is a 67 year old female who presents to physical therapy with decreased bilateral LEs strength. Patient noted [facility] knee AROM bilaterally. Patient's Berg Balance score of 51/56 categorizes her at a low fall risk. Patient note with most difficulties with narrow base of support standing but states may be due to pain hip and knees. Patient would benefit from skilled physical therapy to address deficits and address patient's goals.      Clinical Presentation  Stable    Clinical Decision Making  Low    Rehab Potential  Good    PT Frequency  2x / week    PT Duration  6 weeks    PT Treatment/Interventions  ADLs/Self Care Home Management;Gait training;Stair training;Moist Heat;Cryotherapy;Lobbyist;Therapeutic activities;Therapeutic exercise;Functional mobility training;Neuromuscular re-education;Passive range of motion;Manual techniques;Patient/family education;Taping;Vasopneumatic Device    PT Next Visit Plan  Nustep, LE strengthening especially hips, advanced balance activities per pt tolerance.    PT Home Exercise Plan  see patient education section    Consulted and Agree with Plan of Care  Patient       Patient will benefit from skilled therapeutic intervention in order to improve the following deficits and impairments:  Pain, Decreased activity tolerance, Decreased endurance, Decreased range of motion, Decreased strength, Decreased balance  Visit Diagnosis: Unsteadiness on feet  Muscle weakness (generalized)     Problem List Patient  Active Problem List   Diagnosis Date Noted  . Hyperglycemia 10/21/2016  . Back pain 10/21/2016  . HTN (hypertension) 03/05/2011  . Arthritis 03/05/2011  . GERD (gastroesophageal reflux disease) 03/05/2011  . Hiatal hernia 03/05/2011  . Hyperlipidemia 03/05/2011  . Vitamin D deficiency 03/05/2011  . Renal cyst 03/05/2011    Gabriela Eves, PT, DPT 08/29/2018, 8:46 PM  [facility] [address] [place], [place], [address] Phone: [phone]   Fax:  [phone]  Name: Samantha Olson MRN: [id number] Date of Birth: 1951-02-07

## 2018-08-31 ENCOUNTER — Encounter: Payer: Self-pay | Admitting: Physical Therapy

## 2018-08-31 ENCOUNTER — Ambulatory Visit: Payer: 59 | Admitting: Physical Therapy

## 2018-08-31 DIAGNOSIS — M6281 Muscle weakness (generalized): Secondary | ICD-10-CM

## 2018-08-31 DIAGNOSIS — R2681 Unsteadiness on feet: Secondary | ICD-10-CM

## 2018-08-31 NOTE — Therapy (Signed)
[name], [place], [address] Phone: [phone]   Fax:  [phone]  Physical Therapy Treatment  Patient Details  Name: Samantha Olson MRN: [id number] Date of Birth: January 07, 1951 Referring Provider (PT): [name], MD   Encounter Date: 08/31/2018  PT End of Session - 08/31/18 1033    Visit Number  2    Number of Visits  12    Date for PT Re-Evaluation  10/17/18    Authorization Type  Progress note every 10th visit    PT Start Time  1033    PT Stop Time  1120    PT Time Calculation (min)  47 min    Activity Tolerance  Patient tolerated treatment well    Behavior During Therapy  [facility] for tasks assessed/performed       Past Medical History:  Diagnosis Date  . Cataract   . Chronic kidney disease 1977   hx ofbladder tumor and cyst on kidney  . GERD (gastroesophageal reflux disease)   . H. pylori infection   . Hiatal hernia   . Hyperlipidemia   . Hypertension   . Vitamin D deficiency     Past Surgical History:  Procedure Laterality Date  . Bladder tumor removed    . JOINT REPLACEMENT Left 9/13   knee  . skin ca removal      There were no vitals filed for this visit.  Subjective Assessment - 08/31/18 1027    Subjective  Reports soreness in L hip.    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  Yes    Pain Score  4     Pain Location  Hip    Pain Orientation  Left    Pain Descriptors / Indicators  Sore    Pain Type  Chronic pain    Pain Onset  1 to 4 weeks ago         [facility] PT Assessment - 08/31/18 0001      Assessment   Medical Diagnosis  Gait instability    Referring Provider (PT)  [name], MD    Onset Date/Surgical Date  08/30/18    Next MD Visit  March 2020    Prior Therapy  no      Precautions   Precautions  Fall      Restrictions   Weight Bearing Restrictions  No                   [facility]  Adult PT Treatment/Exercise - 08/31/18 0001      Exercises   Exercises  Knee/Hip      Knee/Hip Exercises: Aerobic   Stationary Bike  L4 x12 min      Knee/Hip Exercises: Standing   Hip Flexion  AROM;Both;15 reps;Knee bent    Hip Abduction  AROM;Both;15 reps;Knee straight      Knee/Hip Exercises: Seated   Long Arc Quad  Strengthening;Both;2 sets;10 reps;Weights    Long Arc Quad Weight  4 lbs.    Clamshell with TheraBand  Green   x20 reps   Hamstring Curl  Strengthening;Both;2 sets;10 reps;Limitations    Hamstring Limitations  green theraband          Balance Exercises - 08/31/18 1115      Balance Exercises: Standing   Standing Eyes Opened  Narrow base of support (BOS);Foam/compliant surface;Head turns   toes on beam; B feet on hor. beam  SLS  Eyes open;Solid surface;Intermittent upper extremity support   for balance pod touches   Heel Raises Limitations  x20 reps    Toe Raise Limitations  x20 reps    Other Standing Exercises  DLS on BOSU x3 min with minimal and intermittant UE support        PT Education - 08/31/18 1111    Education Details  HEP- figure 4 str, LAQ, HS curl, clam, heel and toe raise with green theraband    Person(s) Educated  Patient    Methods  Explanation;Demonstration;Handout    Comprehension  Verbalized understanding          PT Long Term Goals - 08/29/18 2040      PT LONG TERM GOAL #1   Title  Patient will be independent with HEP    Time  6    Period  Weeks    Status  New      PT LONG TERM GOAL #2   Title  Patient will demonsrate 56/56 on Berg Balance scale to decrease risk of falls.     Time  6    Period  Weeks    Status  New      PT LONG TERM GOAL #3   Title  Patient will demonstrate 4/5 or greater hip MMT bilaterally to improve stability during functional tasks.     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      Time  6    Period  Weeks    Status  New            Plan - 08/31/18 1133    Clinical Impression Statement  Patient tolerated today's treatment fairly well with only intermittant reports of L hip soreness. Patient reported greater weakness in LLE. Patient guided through strengthening in both sitting and standing with only minimal VCs for corrective instructions. Patient stands with genu valgus. Patient appears to have greater instability with uneven surfaces such as airex beam. Patient asked throughout treatment regarding any knee or hip pain. Patient provided new HEP for resistance and figure 4 stretch for hip tightness that may affect her standing abilities. Patient verbalized understanding of HEP instructions. Patient reported 4/10 L hip discomfort/soreness following end of treatment.    Rehab Potential  Good    PT Frequency  2x / week    PT Duration  6 weeks    PT Treatment/Interventions  ADLs/Self Care Home Management;Gait training;Stair training;Moist Heat;Cryotherapy;Lobbyist;Therapeutic activities;Therapeutic exercise;Functional mobility training;Neuromuscular re-education;Passive range of motion;Manual techniques;Patient/family education;Taping;Vasopneumatic Device    PT Next Visit Plan  Nustep, LE strengthening especially hips, advanced balance activities per pt tolerance.    PT Home Exercise Plan  see patient education section    Consulted and Agree with Plan of Care  Patient       Patient will benefit from skilled therapeutic intervention in order to improve the following deficits and impairments:  Pain, Decreased activity tolerance, Decreased endurance, Decreased range of motion, Decreased strength, Decreased balance  Visit Diagnosis: Muscle weakness (generalized)  Unsteadiness on feet     Problem List Patient Active Problem List   Diagnosis Date Noted  . Hyperglycemia 10/21/2016  . Back pain 10/21/2016  . HTN (hypertension) 03/05/2011   . Arthritis 03/05/2011  . GERD (gastroesophageal reflux disease) 03/05/2011  .  Hiatal hernia 03/05/2011  . Hyperlipidemia 03/05/2011  . Vitamin D deficiency 03/05/2011  . Renal cyst 03/05/2011    Standley Brooking, PTA 08/31/2018, 11:44 AM  [facility] [address] [place], [place], [address] Phone: [phone]   Fax:  [phone]  Name: TRANISHA TISSUE MRN: [id number] Date of Birth: Sep 15, 1951

## 2018-09-05 ENCOUNTER — Encounter: Payer: Self-pay | Admitting: Physical Therapy

## 2018-09-05 ENCOUNTER — Ambulatory Visit: Payer: 59 | Admitting: Physical Therapy

## 2018-09-05 DIAGNOSIS — M6281 Muscle weakness (generalized): Secondary | ICD-10-CM

## 2018-09-05 DIAGNOSIS — R2681 Unsteadiness on feet: Secondary | ICD-10-CM

## 2018-09-05 NOTE — Therapy (Signed)
[facility] [place], [place], [address] Phone: [phone]   Fax:  [phone]  Physical Therapy Treatment  Patient Details  Name: Samantha Olson MRN: [id number] Date of Birth: 05/26/1951 Referring Provider (PT): [name], MD   Encounter Date: 09/05/2018  PT End of Session - 09/05/18 1033    Visit Number  3    Number of Visits  12    Date for PT Re-Evaluation  10/17/18    Authorization Type  Progress note every 10th visit    PT Start Time  1032    PT Stop Time  1114    PT Time Calculation (min)  42 min    Activity Tolerance  Patient tolerated treatment well    Behavior During Therapy  [facility] for tasks assessed/performed       Past Medical History:  Diagnosis Date  . Cataract   . Chronic kidney disease 1977   hx ofbladder tumor and cyst on kidney  . GERD (gastroesophageal reflux disease)   . H. pylori infection   . Hiatal hernia   . Hyperlipidemia   . Hypertension   . Vitamin D deficiency     Past Surgical History:  Procedure Laterality Date  . Bladder tumor removed    . JOINT REPLACEMENT Left 9/13   knee  . skin ca removal      There were no vitals filed for this visit.  Subjective Assessment - 09/05/18 1032    Subjective  Reports moving boxes this morning prior to PT. Reports no hip pain only R knee discomfort.    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  Yes    Pain Score  3     Pain Location  Knee    Pain Orientation  Right    Pain Descriptors / Indicators  Discomfort    Pain Type  Chronic pain    Pain Onset  1 to 4 weeks ago         [facility] PT Assessment - 09/05/18 0001      Assessment   Medical Diagnosis  Gait instability    Referring Provider (PT)  [name], MD    Onset Date/Surgical Date  08/30/18    Next MD Visit  March 2020    Prior Therapy  no      Precautions   Precautions  Fall      Restrictions   Weight Bearing Restrictions  No                   [facility] Adult PT Treatment/Exercise - 09/05/18 0001      Exercises   Exercises  Knee/Hip      Knee/Hip Exercises: Aerobic   Stationary Bike  L1 x13 min      Knee/Hip Exercises: Standing   Rocker Board  3 minutes      Knee/Hip Exercises: Seated   Long Arc Quad  Strengthening;Both;3 sets;10 reps;Weights    Long Arc Quad Weight  4 lbs.    Ball Squeeze  x20 reps    Clamshell with TheraBand  Red   x20 reps   Hamstring Curl  Strengthening;Both;3 sets;10 reps;Limitations    Hamstring Limitations  red theraband          Balance Exercises - 09/05/18 1100      Balance Exercises: Standing   Standing Eyes Opened  Narrow base of support (BOS);Foam/compliant surface   toes on  beam x3 min   Tandem Stance  Eyes open;Foam/compliant surface;Intermittent upper extremity support;1 rep;Time   x3 min   Cone Rotation  Foam/compliant surface;R/L   x3 RT   Heel Raises Limitations  x20 reps    Toe Raise Limitations  x20 reps    Other Standing Exercises  DLS on BOSU x3 min with minimal and intermittant UE support             PT Long Term Goals - 09/05/18 1044      PT LONG TERM GOAL #1   Title  Patient will be independent with HEP    Time  6    Period  Weeks    Status  On-going      PT LONG TERM GOAL #2   Title  Patient will demonsrate 56/56 on Berg Balance scale to decrease risk of falls.     Time  6    Period  Weeks    Status  On-going      PT LONG TERM GOAL #3   Title  Patient will demonstrate 4/5 or greater hip MMT bilaterally to improve stability during functional tasks.     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     Time  6    Period  Weeks    Status  On-going            Plan - 09/05/18 1116    Clinical Impression Statement  Patient tolerated today's treatment well with only  minimal complaints of R knee pain. Patient able to tolerate more hip/knee strengthening in sitting. More uneven surface and functional rotation like exercises completed due to patient's reports of instability. Appropriate ankle strategies noted through balance activities with intermittant UE support required. VCs for core activation provided as well to provide stable base. No complaints of hip pain following end of treatment.     Rehab Potential  Good    PT Frequency  2x / week    PT Duration  6 weeks    PT Treatment/Interventions  ADLs/Self Care Home Management;Gait training;Stair training;Moist Heat;Cryotherapy;Lobbyist;Therapeutic activities;Therapeutic exercise;Functional mobility training;Neuromuscular re-education;Passive range of motion;Manual techniques;Patient/family education;Taping;Vasopneumatic Device    PT Next Visit Plan  Nustep, LE strengthening especially hips, advanced balance activities per pt tolerance.    PT Home Exercise Plan  see patient education section    Consulted and Agree with Plan of Care  Patient       Patient will benefit from skilled therapeutic intervention in order to improve the following deficits and impairments:  Pain, Decreased activity tolerance, Decreased endurance, Decreased range of motion, Decreased strength, Decreased balance  Visit Diagnosis: Muscle weakness (generalized)  Unsteadiness on feet     Problem List Patient Active Problem List   Diagnosis Date Noted  . Hyperglycemia 10/21/2016  . Back pain 10/21/2016  . HTN (hypertension) 03/05/2011  . Arthritis 03/05/2011  . GERD (gastroesophageal reflux disease) 03/05/2011  . Hiatal hernia 03/05/2011  . Hyperlipidemia 03/05/2011  . Vitamin D deficiency 03/05/2011  . Renal cyst 03/05/2011    Standley Brooking, PTA 09/05/2018, 12:09 PM  [facility] [address] [place], [place], [address] Phone: [phone]   Fax:   [phone]  Name: Samantha Olson MRN: [id number] Date of Birth: 1951-04-06

## 2018-09-12 ENCOUNTER — Encounter: Payer: Self-pay | Admitting: Physical Therapy

## 2018-09-12 ENCOUNTER — Ambulatory Visit: Payer: 59 | Attending: Pediatrics | Admitting: Physical Therapy

## 2018-09-12 DIAGNOSIS — M6281 Muscle weakness (generalized): Secondary | ICD-10-CM | POA: Diagnosis present

## 2018-09-12 DIAGNOSIS — R2681 Unsteadiness on feet: Secondary | ICD-10-CM | POA: Insufficient documentation

## 2018-09-12 NOTE — Therapy (Signed)
[facility] [place], [place], [address] Phone: [phone]   Fax:  [phone]  Physical Therapy Treatment  Patient Details  Name: Samantha Olson MRN: [id number] Date of Birth: 1950-11-16 Referring Provider (PT): [name], MD   Encounter Date: 09/12/2018  PT End of Session - 09/12/18 1123    Visit Number  4    Number of Visits  12    Date for PT Re-Evaluation  10/17/18    Authorization Type  Progress note every 10th visit    PT Start Time  1030    PT Stop Time  1116    PT Time Calculation (min)  46 min    Activity Tolerance  Patient tolerated treatment well       Past Medical History:  Diagnosis Date  . Cataract   . Chronic kidney disease 1977   hx ofbladder tumor and cyst on kidney  . GERD (gastroesophageal reflux disease)   . H. pylori infection   . Hiatal hernia   . Hyperlipidemia   . Hypertension   . Vitamin D deficiency     Past Surgical History:  Procedure Laterality Date  . Bladder tumor removed    . JOINT REPLACEMENT Left 9/13   knee  . skin ca removal      There were no vitals filed for this visit.  Subjective Assessment - 09/12/18 1033    Subjective  Patient reports some right knee pain.     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  Yes    Pain Score  4     Pain Location  Knee    Pain Orientation  Right    Pain Descriptors / Indicators  Discomfort    Pain Type  Chronic pain    Pain Onset  1 to 4 weeks ago         [facility] PT Assessment - 09/12/18 0001      Assessment   Medical Diagnosis  Gait instability    Referring Provider (PT)  [name], MD    Onset Date/Surgical Date  08/30/18    Next MD Visit  March 2020    Prior Therapy  no      Precautions   Precautions  Fall      Restrictions   Weight Bearing Restrictions  No                   [facility] Adult PT Treatment/Exercise - 09/12/18 0001       Exercises   Exercises  Knee/Hip      Knee/Hip Exercises: Aerobic   Stationary Bike  L3 x15 min      Knee/Hip Exercises: Standing   Rocker Board  3 minutes      Knee/Hip Exercises: Seated   Long Arc Quad  Strengthening;Both;3 sets;10 reps;Weights    Long Arc Quad Weight  4 lbs.    Clamshell with TheraBand  Red   x30   Hamstring Curl  Strengthening;Both;3 sets;10 reps;Limitations    Hamstring Limitations  red theraband          Balance Exercises - 09/12/18 1052      Balance Exercises: Standing   Tandem Stance  --    SLS  Eyes open;Solid surface;Intermittent upper extremity support   ball toss with toe touch down x10 each   Balance Beam  lateral stepping with intermittent UE support x3 minutes    Tandem  Gait  Forward;Intermittent upper extremity support   x2 minutes; on beam   Heel Raises Limitations  2x15 reps    Toe Raise Limitations  2x15 reps    Other Standing Exercises  DLS on BOSU x3 min with minimal and intermittant UE support             PT Long Term Goals - 09/05/18 1044      PT LONG TERM GOAL #1   Title  Patient will be independent with HEP    Time  6    Period  Weeks    Status  On-going      PT LONG TERM GOAL #2   Title  Patient will demonsrate 56/56 on Berg Balance scale to decrease risk of falls.     Time  6    Period  Weeks    Status  On-going      PT LONG TERM GOAL #3   Title  Patient will demonstrate 4/5 or greater hip MMT bilaterally to improve stability during functional tasks.     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     Time  6    Period  Weeks    Status  On-going            Plan - 09/12/18 1123    Clinical Impression Statement  Patient was able to tolerate treatment well depite reports of tightness in right knee throughout session. Patient was able to perform advanced balance activities with close  supervision and with intermittent UE support. Patient demonstrated very good ankle and hip strategies to maintain balance. 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 Continue balance beam balance activities.     Clinical Presentation  Stable    Clinical Decision Making  Low    Rehab Potential  Good    PT Frequency  2x / week    PT Duration  6 weeks    PT Treatment/Interventions  ADLs/Self Care Home Management;Gait training;Stair training;Moist Heat;Cryotherapy;Lobbyist;Therapeutic activities;Therapeutic exercise;Functional mobility training;Neuromuscular re-education;Passive range of motion;Manual techniques;Patient/family education;Taping;Vasopneumatic Device    PT Next Visit Plan  Nustep, LE strengthening especially hips, advanced balance activities per pt tolerance.    Consulted and Agree with Plan of Care  Patient       Patient will benefit from skilled therapeutic intervention in order to improve the following deficits and impairments:  Pain, Decreased activity tolerance, Decreased endurance, Decreased range of motion, Decreased strength, Decreased balance  Visit Diagnosis: Muscle weakness (generalized)  Unsteadiness on feet     Problem List Patient Active Problem List   Diagnosis Date Noted  . Hyperglycemia 10/21/2016  . Back pain 10/21/2016  . HTN (hypertension) 03/05/2011  . Arthritis 03/05/2011  . GERD (gastroesophageal reflux disease) 03/05/2011  . Hiatal hernia 03/05/2011  . Hyperlipidemia 03/05/2011  . Vitamin D deficiency 03/05/2011  . Renal cyst 03/05/2011   Gabriela Eves, PT, DPT 09/12/2018, 12:51 PM  [facility] [address] [place], [place], [address] Phone: [phone]   Fax:  [phone]  Name: Samantha Olson MRN: [id number] Date of Birth: 23-Sep-1951

## 2018-09-14 ENCOUNTER — Ambulatory Visit: Payer: 59 | Admitting: Physical Therapy

## 2018-09-14 ENCOUNTER — Encounter: Payer: Self-pay | Admitting: Physical Therapy

## 2018-09-14 DIAGNOSIS — R2681 Unsteadiness on feet: Secondary | ICD-10-CM

## 2018-09-14 DIAGNOSIS — M6281 Muscle weakness (generalized): Secondary | ICD-10-CM | POA: Diagnosis not present

## 2018-09-14 NOTE — Therapy (Signed)
[facility] [place], [place], [address] Phone: [phone]   Fax:  [phone]  Physical Therapy Treatment  Patient Details  Name: Samantha Olson MRN: [id number] Date of Birth: April 05, 1951 Referring Provider (PT): [name], MD   Encounter Date: 09/14/2018  PT End of Session - 09/14/18 1111    Visit Number  5    Number of Visits  12    Date for PT Re-Evaluation  10/17/18    Authorization Type  Progress note every 10th visit    PT Start Time  1029    PT Stop Time  1114    PT Time Calculation (min)  45 min    Activity Tolerance  Patient tolerated treatment well    Behavior During Therapy  [facility] for tasks assessed/performed       Past Medical History:  Diagnosis Date  . Cataract   . Chronic kidney disease 1977   hx ofbladder tumor and cyst on kidney  . GERD (gastroesophageal reflux disease)   . H. pylori infection   . Hiatal hernia   . Hyperlipidemia   . Hypertension   . Vitamin D deficiency     Past Surgical History:  Procedure Laterality Date  . Bladder tumor removed    . JOINT REPLACEMENT Left 9/13   knee  . skin ca removal      There were no vitals filed for this visit.  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  Yes    Pain Score  3     Pain Location  Knee    Pain Orientation  Right    Pain Descriptors / Indicators  Tightness    Pain Type  Chronic pain    Pain Onset  1 to 4 weeks ago    Pain Frequency  Intermittent    Aggravating Factors   ADL's stairs    Pain Relieving Factors  heat and advil                       [facility] Adult PT Treatment/Exercise - 09/14/18 0001      Exercises   Exercises  Knee/Hip      Knee/Hip Exercises: Aerobic   Stationary Bike  L3 x15 min      Knee/Hip Exercises: Machines for Strengthening   Cybex Knee Extension  10# 3x10    Cybex Knee Flexion  20# 3x10      Knee/Hip Exercises: Supine   Bridges  Strengthening;Both;20 reps      Knee/Hip Exercises: Sidelying   Hip ABduction  Strengthening;Both;2 sets;10 reps          Balance Exercises - 09/14/18 1051      Balance Exercises: Standing   Rockerboard  Anterior/posterior;Other (comment)   x73min   Tandem Gait  Foam/compliant surface;4 reps;Upper extremity support;Forward;Retro    Other Standing Exercises  resisted walking forwad/backward/side to side pink XTS             PT Long Term Goals - 09/05/18 1044      PT LONG TERM GOAL #1   Title  Patient will be independent with HEP    Time  6    Period  Weeks    Status  On-going  PT LONG TERM GOAL #2   Title  Patient will demonsrate 56/56 on Berg Balance scale to decrease risk of falls.     Time  6    Period  Weeks    Status  On-going      PT LONG TERM GOAL #3   Title  Patient will demonstrate 4/5 or greater hip MMT bilaterally to improve stability during functional tasks.     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     Time  6    Period  Weeks    Status  On-going            Plan - 09/14/18 1112    Clinical Impression Statement  Patient tolerated treatment well today. Patient able to progress with exercises today, focused on LE strengthening and balance activities. Patient reported some compensation due to right knee. Patient has reported no falls yet some episodes of LOB. Patient doing HEP daily as given. Goals ongoing.    Rehab Potential  Good    PT Frequency  2x / week    PT Duration  6 weeks    PT Treatment/Interventions  ADLs/Self Care Home Management;Gait training;Stair training;Moist Heat;Cryotherapy;Lobbyist;Therapeutic  activities;Therapeutic exercise;Functional mobility training;Neuromuscular re-education;Passive range of motion;Manual techniques;Patient/family education;Taping;Vasopneumatic Device    PT Next Visit Plan  Nustep, LE strengthening especially hips, advanced balance activities per pt tolerance.    Consulted and Agree with Plan of Care  Patient       Patient will benefit from skilled therapeutic intervention in order to improve the following deficits and impairments:  Pain, Decreased activity tolerance, Decreased endurance, Decreased range of motion, Decreased strength, Decreased balance  Visit Diagnosis: Muscle weakness (generalized)  Unsteadiness on feet     Problem List Patient Active Problem List   Diagnosis Date Noted  . Hyperglycemia 10/21/2016  . Back pain 10/21/2016  . HTN (hypertension) 03/05/2011  . Arthritis 03/05/2011  . GERD (gastroesophageal reflux disease) 03/05/2011  . Hiatal hernia 03/05/2011  . Hyperlipidemia 03/05/2011  . Vitamin D deficiency 03/05/2011  . Renal cyst 03/05/2011    Phillips Climes, PTA 09/14/2018, 11:17 AM  [facility] [place], [place], [address] Phone: [phone]   Fax:  [phone]  Name: Samantha Olson MRN: [id number] Date of Birth: July 17, 1951

## 2018-09-20 ENCOUNTER — Encounter: Payer: Self-pay | Admitting: Physical Therapy

## 2018-09-20 ENCOUNTER — Ambulatory Visit: Payer: 59 | Admitting: Physical Therapy

## 2018-09-20 DIAGNOSIS — M6281 Muscle weakness (generalized): Secondary | ICD-10-CM

## 2018-09-20 DIAGNOSIS — R2681 Unsteadiness on feet: Secondary | ICD-10-CM

## 2018-09-20 NOTE — Therapy (Signed)
[facility] [place], [place], [address] Phone: [phone]   Fax:  [phone]  Physical Therapy Treatment  Patient Details  Name: Samantha Olson MRN: [id number] Date of Birth: 02-25-1951 Referring Provider (PT): [name], MD   Encounter Date: 09/20/2018  PT End of Session - 09/20/18 1029    Visit Number  6    Number of Visits  12    Date for PT Re-Evaluation  10/17/18    Authorization Type  Progress note every 10th visit    PT Start Time  1031    PT Stop Time  1114    PT Time Calculation (min)  43 min    Activity Tolerance  Patient tolerated treatment well    Behavior During Therapy  [facility] for tasks assessed/performed       Past Medical History:  Diagnosis Date  . Cataract   . Chronic kidney disease 1977   hx ofbladder tumor and cyst on kidney  . GERD (gastroesophageal reflux disease)   . H. pylori infection   . Hiatal hernia   . Hyperlipidemia   . Hypertension   . Vitamin D deficiency     Past Surgical History:  Procedure Laterality Date  . Bladder tumor removed    . JOINT REPLACEMENT Left 9/13   knee  . skin ca removal      There were no vitals filed for this visit.  Subjective Assessment - 09/20/18 1029    Subjective  Reports that she is still experiencing R knee tightness.    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  Yes    Pain Score  4     Pain Location  Knee    Pain Orientation  Right    Pain Descriptors / Indicators  Tightness    Pain Type  Chronic pain    Pain Onset  1 to 4 weeks ago    Pain Frequency  Intermittent         [facility] PT Assessment - 09/20/18 0001      Assessment   Medical Diagnosis  Gait instability    Referring Provider (PT)  [name], MD    Onset Date/Surgical Date  08/30/18    Next MD Visit  March 2020    Prior Therapy  no      Precautions   Precautions  Fall      Restrictions    Weight Bearing Restrictions  No                   [facility] Adult PT Treatment/Exercise - 09/20/18 0001      Exercises   Exercises  Knee/Hip      Knee/Hip Exercises: Aerobic   Stationary Bike  L3 x15 min      Knee/Hip Exercises: Machines for Strengthening   Cybex Knee Extension  10# 3x10    Cybex Knee Flexion  30# 3x10 reps    Cybex Leg Press  2 pl, seat 9 x30 reps with ball squeeze      Knee/Hip Exercises: Standing   Hip Abduction  AROM;Both;2 sets;10 reps;Knee straight    Functional Squat  10 reps;3 seconds   "mini squat"   Other Standing Knee Exercises  Sidestep green theraband x6 reps          Balance Exercises - 09/20/18 1109      Balance Exercises: Standing   Rockerboard  Anterior/posterior;EO;Other time (comment);Intermittent  UE support;EC   x3 min EO; x2 min EC   Tandem Gait  Forward;Retro;Intermittent upper extremity support;Foam/compliant surface;5 reps    Sidestepping  Foam/compliant support             PT Long Term Goals - 09/05/18 1044      PT LONG TERM GOAL #1   Title  Patient will be independent with HEP    Time  6    Period  Weeks    Status  On-going      PT LONG TERM GOAL #2   Title  Patient will demonsrate 56/56 on Berg Balance scale to decrease risk of falls.     Time  6    Period  Weeks    Status  On-going      PT LONG TERM GOAL #3   Title  Patient will demonstrate 4/5 or greater hip MMT bilaterally to improve stability during functional tasks.     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     Time  6    Period  Weeks    Status  On-going            Plan - 09/20/18 1213    Clinical Impression Statement  Patient tolerated today's treatment well although initially limited due to R knee pain/tightness. Patient able to complete all exercises with no complaints of any increased R knee pain. Patient did  report pinch in L hip with R hip abduction to improve stability. No other complaints during treatment but VCs provided for focus to improve balance. Patient able to complete balance better with focus on stable enviromental object.     Rehab Potential  Good    PT Frequency  2x / week    PT Duration  6 weeks    PT Treatment/Interventions  ADLs/Self Care Home Management;Gait training;Stair training;Moist Heat;Cryotherapy;Lobbyist;Therapeutic activities;Therapeutic exercise;Functional mobility training;Neuromuscular re-education;Passive range of motion;Manual techniques;Patient/family education;Taping;Vasopneumatic Device    PT Next Visit Plan  Nustep, LE strengthening especially hips, advanced balance activities per pt tolerance.    PT Home Exercise Plan  see patient education section    Consulted and Agree with Plan of Care  Patient       Patient will benefit from skilled therapeutic intervention in order to improve the following deficits and impairments:  Pain, Decreased activity tolerance, Decreased endurance, Decreased range of motion, Decreased strength, Decreased balance  Visit Diagnosis: Muscle weakness (generalized)  Unsteadiness on feet     Problem List Patient Active Problem List   Diagnosis Date Noted  . Hyperglycemia 10/21/2016  . Back pain 10/21/2016  . HTN (hypertension) 03/05/2011  . Arthritis 03/05/2011  . GERD (gastroesophageal reflux disease) 03/05/2011  . Hiatal hernia 03/05/2011  . Hyperlipidemia 03/05/2011  . Vitamin D deficiency 03/05/2011  . Renal cyst 03/05/2011    Standley Brooking, PTA 09/20/2018, 12:19 PM  [facility] [address] [place], [place], [address] Phone: [phone]   Fax:  [phone]  Name: Samantha Olson MRN: [id number] Date of Birth: 1950/10/27

## 2018-09-23 ENCOUNTER — Ambulatory Visit: Payer: 59 | Admitting: Physical Therapy

## 2018-09-23 ENCOUNTER — Encounter: Payer: Self-pay | Admitting: Physical Therapy

## 2018-09-23 DIAGNOSIS — M6281 Muscle weakness (generalized): Secondary | ICD-10-CM

## 2018-09-23 DIAGNOSIS — R2681 Unsteadiness on feet: Secondary | ICD-10-CM

## 2018-09-23 LAB — COLOGUARD: Cologuard: POSITIVE

## 2018-09-23 NOTE — Therapy (Addendum)
[facility] [address] [place], [place], [address] Phone: [phone]   Fax:  [phone]  Physical Therapy Treatment  PHYSICAL THERAPY DISCHARGE SUMMARY  Visits from Start of Care: 7  Current functional level related to goals / functional outcomes: See below   Remaining deficits: See goals   Education / Equipment: HEP Plan: Patient agrees to discharge.  Patient goals were met. Patient is being discharged due to meeting the stated rehab goals.  ?????    Krystle Mangawang, PT, DPT 01/23/19    Patient Details  Name: Samantha Olson MRN: [id number] Date of Birth: 12/14/1950 Referring Provider (PT): Carol Vincent, MD   Encounter Date: 09/23/2018  PT End of Session - 09/23/18 1034    Visit Number  7    Number of Visits  12    Date for PT Re-Evaluation  10/17/18    Authorization Type  Progress note every 10th visit    PT Start Time  1030    PT Stop Time  1116    PT Time Calculation (min)  46 min    Activity Tolerance  Patient tolerated treatment well    Behavior During Therapy  WFL for tasks assessed/performed       Past Medical History:  Diagnosis Date  . Cataract   . Chronic kidney disease 1977   hx ofbladder tumor and cyst on kidney  . GERD (gastroesophageal reflux disease)   . H. pylori infection   . Hiatal hernia   . Hyperlipidemia   . Hypertension   . Vitamin D deficiency     Past Surgical History:  Procedure Laterality Date  . Bladder tumor removed    . JOINT REPLACEMENT Left 9/13   knee  . skin ca removal      There were no vitals filed for this visit.  Subjective Assessment - 09/23/18 1042    Subjective  Patient reports ongoing "shakiness" in bilateral quads.    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  Yes    Pain Score  2     Pain Location  Knee    Pain Orientation  Right    Pain Descriptors / Indicators  Tightness    Pain  Type  Chronic pain    Pain Onset  More than a month ago    Pain Frequency  Intermittent         [facility] PT Assessment - 09/23/18 0001      Assessment   Medical Diagnosis  Gait instability    Referring Provider (PT)  Carol Vincent, MD    Onset Date/Surgical Date  08/30/18    Next MD Visit  March 2020    Prior Therapy  no      Precautions   Precautions  Fall      Restrictions   Weight Bearing Restrictions  No      Strength   Right Hip Flexion  4/5    Right Hip ABduction  4/5    Left Hip Flexion  4/5    Left Hip ABduction  4/5    Right Knee Flexion  4+/5    Right Knee Extension  5/5    Left Knee Flexion  4+/5    Left Knee Extension  5/5      Berg Balance Test   Sit to Stand  Able to stand without using hands and stabilize independently    Standing Unsupported  Able to   stand safely 2 minutes    Sitting with Back Unsupported but Feet Supported on Floor or Stool  Able to sit safely and securely 2 minutes    Stand to Sit  Sits safely with minimal use of hands    Transfers  Able to transfer safely, minor use of hands    Standing Unsupported with Eyes Closed  Able to stand 10 seconds safely    Standing Ubsupported with Feet Together  Able to place feet together independently and stand 1 minute safely    From Standing, Reach Forward with Outstretched Arm  Can reach confidently >25 cm (10")    From Standing Position, Pick up Object from Floor  Able to pick up shoe safely and easily    From Standing Position, Turn to Look Behind Over each Shoulder  Looks behind from both sides and weight shifts well    Turn 360 Degrees  Able to turn 360 degrees safely in 4 seconds or less    Standing Unsupported, Alternately Place Feet on Step/Stool  Able to stand independently and safely and complete 8 steps in 20 seconds    Standing Unsupported, One Foot in Front  Able to place foot tandem independently and hold 30 seconds    Standing on One Leg  Able to lift leg independently and hold > 10 seconds     Total Score  56                   [facility] Adult PT Treatment/Exercise - 09/23/18 0001      Exercises   Exercises  Knee/Hip      Knee/Hip Exercises: Aerobic   Stationary Bike  L3 x15 min      Knee/Hip Exercises: Machines for Strengthening   Cybex Knee Extension  10# 2x10; with eccentric control    Cybex Knee Flexion  30# 3x10 reps    Cybex Leg Press  --      Knee/Hip Exercises: Standing   Step Down  Right;2 sets;10 reps;Hand Hold: 2;Step Height: 4"    Functional Squat  --                  PT Long Term Goals - 09/23/18 1043      PT LONG TERM GOAL #1   Title  Patient will be independent with HEP    Time  6    Period  Weeks    Status  Achieved      PT LONG TERM GOAL #2   Title  Patient will demonsrate 56/56 on Berg Balance scale to decrease risk of falls.     Time  6    Period  Weeks    Status  Achieved      PT LONG TERM GOAL #3   Title  Patient will demonstrate 4/5 or greater hip MMT bilaterally to improve stability during functional tasks.     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     Time  6    Period  Weeks    Status  Achieved            Plan - 09/23/18 1034    Clinical Impression Statement  Patient was able to tolerate treatment well despite right knee tightness. Patient was able to demonstrate good form with addition  of eccentric control on knee extension machine. Patient's goals have been met. Patient and PT discussed placing patient on hold until re-certification date. Patient also provided with infomation about gym program. Patient reported understanding. Patient provided with HEP.    Clinical Presentation  Stable    Clinical Decision Making  Low    Rehab Potential  Good    PT Frequency  2x / week    PT Duration  6 weeks    PT Treatment/Interventions  ADLs/Self Care Home Management;Gait training;Stair  training;Moist Heat;Cryotherapy;Electrical Stimulation;Balance training;Therapeutic activities;Therapeutic exercise;Functional mobility training;Neuromuscular re-education;Passive range of motion;Manual techniques;Patient/family education;Taping;Vasopneumatic Device    PT Next Visit Plan  On hold; will return before 10/17/18 if she needs more visits.    PT Home Exercise Plan  see patient education section    Consulted and Agree with Plan of Care  Patient       Patient will benefit from skilled therapeutic intervention in order to improve the following deficits and impairments:  Pain, Decreased activity tolerance, Decreased endurance, Decreased range of motion, Decreased strength, Decreased balance  Visit Diagnosis: Muscle weakness (generalized)  Unsteadiness on feet     Problem List Patient Active Problem List   Diagnosis Date Noted  . Hyperglycemia 10/21/2016  . Back pain 10/21/2016  . HTN (hypertension) 03/05/2011  . Arthritis 03/05/2011  . GERD (gastroesophageal reflux disease) 03/05/2011  . Hiatal hernia 03/05/2011  . Hyperlipidemia 03/05/2011  . Vitamin D deficiency 03/05/2011  . Renal cyst 03/05/2011   Krystle Mangawang, PT, DPT 09/23/2018, 12:01 PM  [facility] [address] [place], [place], [address] Phone: [phone]   Fax:  [phone]  Name: Kristianna K Renstrom MRN: [id number] Date of Birth: 04/05/1951   

## 2018-09-27 ENCOUNTER — Other Ambulatory Visit: Payer: Self-pay | Admitting: *Deleted

## 2018-09-27 DIAGNOSIS — R195 Other fecal abnormalities: Secondary | ICD-10-CM

## 2018-10-19 ENCOUNTER — Ambulatory Visit: Payer: 59 | Admitting: Pediatrics

## 2018-11-15 ENCOUNTER — Other Ambulatory Visit: Payer: Self-pay | Admitting: Pediatrics

## 2018-11-16 ENCOUNTER — Ambulatory Visit: Payer: 59 | Admitting: Family Medicine

## 2018-11-16 VITALS — BP 126/68 | HR 76 | Temp 98.2°F | Ht 69.0 in | Wt 205.0 lb

## 2018-11-16 DIAGNOSIS — T753XXA Motion sickness, initial encounter: Secondary | ICD-10-CM

## 2018-11-16 DIAGNOSIS — R0609 Other forms of dyspnea: Secondary | ICD-10-CM | POA: Insufficient documentation

## 2018-11-16 DIAGNOSIS — F419 Anxiety disorder, unspecified: Secondary | ICD-10-CM

## 2018-11-16 DIAGNOSIS — F339 Major depressive disorder, recurrent, unspecified: Secondary | ICD-10-CM

## 2018-11-16 DIAGNOSIS — Z8249 Family history of ischemic heart disease and other diseases of the circulatory system: Secondary | ICD-10-CM

## 2018-11-16 DIAGNOSIS — F439 Reaction to severe stress, unspecified: Secondary | ICD-10-CM

## 2018-11-16 DIAGNOSIS — J3089 Other allergic rhinitis: Secondary | ICD-10-CM

## 2018-11-16 DIAGNOSIS — R0600 Dyspnea, unspecified: Secondary | ICD-10-CM

## 2018-11-16 MED ORDER — SERTRALINE HCL 100 MG PO TABS: 50.0000 mg | ORAL_TABLET | Freq: Every day | ORAL | 0 refills | Status: DC

## 2018-11-16 MED ORDER — SCOPOLAMINE 1 MG/3DAYS TD PT72: 1.0000 | MEDICATED_PATCH | TRANSDERMAL | 0 refills | Status: DC

## 2018-11-16 MED ORDER — AZELASTINE HCL 0.1 % NA SOLN: 1.0000 | Freq: Two times a day (BID) | NASAL | 12 refills | Status: DC

## 2018-11-16 MED ORDER — MECLIZINE HCL 25 MG PO TABS: 25.0000 mg | ORAL_TABLET | Freq: Two times a day (BID) | ORAL | 0 refills | Status: DC | PRN

## 2018-11-16 NOTE — Patient Instructions (Signed)
We have started Astelin for your nasal symptoms  I have placed a referral to the heart specialist in [place].  Let me know if you don't hear for an appointment in the next week  I have sent a script for the motion sickness patch.  If unaffordable, pick up Meclizine.  We discussed counseling and Buspar as options for anxiety today.  Let me know if either interest you.

## 2018-11-16 NOTE — Progress Notes (Signed)
Subjective: CC: Anxiety PCP: [name], DO YTK:PTWS K Desta is a 68 y.o. female presenting to clinic today for:  1. Anxiety/ depression Patient here for follow-up on anxiety and depression.  She notes that she decrease the dose of Zoloft to 50 mg daily because she did not find the 100 mg to be especially effective.  Depressive symptoms seem to be stable but her anxiety symptoms seem to be worsened.  She reports increased stress at home surrounding the health of her husband, daughter and other daughter going through a divorce.  She has overwhelming feelings much of the time that is relieved somewhat by working outside.  She notes she works to the point of exhaustion so that she cannot focus on her stressors.  No other medications previously tried except for Zoloft.  She is not sure that she is ready to add any medications at this time and is hoping that the stressors will improve and that she can ultimately get off of Zoloft.  She does not wish to see a counselor at this time because she is "private".  2.  Allergic rhinitis Patient reports chronic drainage on the left nare and within the left eustachian tube.  She reports feeling postnasal drip frequently feeling like her throat is irritated from this.  She is on oral loratadine as well as nasal saline spray.  She was previously treated with Flonase nasal spray for sinusitis but discontinued this after she felt that the fullness in the sinuses improved.  3.  Family history of aortic aneurysm Patient reports that her daughter was recently diagnosed with aortic aneurysm.  There thought that this to be geneticly linked and recommended that patient have evaluation.  She notes that since her last visit with her previous PCP she has been having some exercise intolerance specifically ambulating upstairs.  She feels that she gets somewhat winded and fatigued from this.  She has not experienced this before.  She otherwise tolerates exercise and  activity without difficulty.  Denies any lower extremity edema, orthopnea, chest pain.  She would like formal referral to cardiology in [place].  4.  Upcoming trip Patient reports motion sickness in the past with cruises.  She has a cruise that is upcoming this weekend to the [place].  She would like to get a scopolamine patch, which has worked for her in the past.  She denies intolerance including excessive dryness, constipation or difficulty with urination on this previously.    ROS: Per HPI  Allergies  Allergen Reactions  . Ace Inhibitors Cough  . Celexa [Citalopram Hydrobromide]     Shakes- chest and arms  . Penicillins Swelling   Past Medical History:  Diagnosis Date  . Cataract   . Chronic kidney disease 1977   hx ofbladder tumor and cyst on kidney  . GERD (gastroesophageal reflux disease)   . H. pylori infection   . Hiatal hernia   . Hyperlipidemia   . Hypertension   . Vitamin D deficiency     Current Outpatient Medications:  .  Cholecalciferol (VITAMIN D) 2000 UNITS CAPS, Take by mouth. 1 daily  , Disp: , Rfl:  .  cyclobenzaprine (FLEXERIL) 5 MG tablet, Take 1 tablet (5 mg total) by mouth 3 (three) times daily as needed. for muscle spams, Disp: 30 tablet, Rfl: 3 .  hydrochlorothiazide (HYDRODIURIL) 12.5 MG tablet, TAKE 1 TABLET BY MOUTH ONCE DAILY, Disp: 90 tablet, Rfl: 0 .  losartan (COZAAR) 100 MG tablet, Take 1 tablet (100 mg total)  by mouth daily., Disp: 90 tablet, Rfl: 0 .  sertraline (ZOLOFT) 100 MG tablet, Take 1 tablet (100 mg total) by mouth daily. (Patient taking differently: Take 50 mg by mouth daily. ), Disp: 90 tablet, Rfl: 1 Social History   Socioeconomic History  . Marital status: Married    Spouse name: Not on file  . Number of children: Not on file  . Years of education: Not on file  . Highest education level: Not on file  Occupational History  . Not on file  Social Needs  . Financial resource strain: Not on file  . Food insecurity:    Worry:  Not on file    Inability: Not on file  . Transportation needs:    Medical: Not on file    Non-medical: Not on file  Tobacco Use  . Smoking status: Never Smoker  . Smokeless tobacco: Never Used  Substance and Sexual Activity  . Alcohol use: Yes    Comment: rare use  . Drug use: No  . Sexual activity: Not on file  Lifestyle  . Physical activity:    Days per week: Not on file    Minutes per session: Not on file  . Stress: Not on file  Relationships  . Social connections:    Talks on phone: Not on file    Gets together: Not on file    Attends religious service: Not on file    Active member of club or organization: Not on file    Attends meetings of clubs or organizations: Not on file    Relationship status: Not on file  . Intimate partner violence:    Fear of current or ex partner: Not on file    Emotionally abused: Not on file    Physically abused: Not on file    Forced sexual activity: Not on file  Other Topics Concern  . Not on file  Social History Narrative  . Not on file   Family History  Problem Relation Age of Onset  . Cancer Father        stomach cancer  . Hypertension Mother   . Hyperlipidemia Mother   . Stroke Maternal Grandmother   . Hypertension Maternal Grandmother     Objective: Office vital signs reviewed. BP 126/68   Pulse 76   Temp 98.2 F (36.8 C) (Oral)   Ht 5\' 9"  (1.753 m)   Wt 205 lb (93 kg)   BMI 30.27 kg/m   Physical Examination:  General: Awake, alert, well nourished, No acute distress HEENT: Normal    Neck: No masses palpated. No lymphadenopathy    Ears: Tympanic membranes intact, normal light reflex, no erythema, no bulging    Eyes: PERRLA, extraocular membranes intact, sclera white    Nose: nasal turbinates moist, edematous and erythematous.  With clear nasal discharge    Throat: moist mucus membranes, no erythema, no tonsillar exudate.  Airway is patent Cardio: regular rate and rhythm, S1S2 heard, no murmurs appreciated Pulm:  clear to auscultation bilaterally, no wheezes, rhonchi or rales; normal work of breathing on room air Psych: mood depressed, stoic. Pleasant. Reserved. Depression screen [facility] 2/9 11/16/2018 08/22/2018 05/19/2018 04/18/2018 01/12/2018  Decreased Interest 0 0 0 0 1  Down, Depressed, Hopeless 1 0 0 0 1  PHQ - 2 Score 1 0 0 0 2  Altered sleeping 0 - - - 1  Tired, decreased energy 0 - - - 1  Change in appetite 0 - - - 1  Feeling bad or  failure about yourself  0 - - - 1  Trouble concentrating 0 - - - 2  Moving slowly or fidgety/restless 0 - - - 0  Suicidal thoughts 0 - - - 0  PHQ-9 Score 1 - - - 8  Difficult doing work/chores - - - - Somewhat difficult   GAD 7 : Generalized Anxiety Score 11/16/2018 07/28/2017  Nervous, Anxious, on Edge 2 1  Control/stop worrying 2 3  Worry too much - different things 2 3  Trouble relaxing 2 2  Restless 0 0  Easily annoyed or irritable 2 3  Afraid - awful might happen 0 3  Total GAD 7 Score 10 15  Anxiety Difficulty Somewhat difficult -    Assessment/ Plan: 68 y.o. female   1. Motion sickness, initial encounter Scopolamine patch prescribed.  May be on manufacturer back order and therefore written prescription for meclizine 25 mg p.o. twice daily as needed motion sickness prescribed, #20.  0 refills. - scopolamine (TRANSDERM-SCOP, 1.5 MG,) 1 MG/3DAYS; Place 1 patch (1.5 mg total) onto the skin every 3 (three) days. ( for sea sickness)  Dispense: 4 patch; Refill: 0  2. Family history of aortic aneurysm Referral to cardiology in [facility] placed. - Ambulatory referral to Cardiology  3. Dyspnea on exertion No evidence of fluid overload or CHF on today's exam.  Referral placed as above - Ambulatory referral to Cardiology  4. Non-seasonal allergic rhinitis, unspecified trigger Add Astelin nasal spray.  Instructions for use discussed.  Follow-up PRN. - azelastine (ASTELIN) 0.1 % nasal spray; Place 1 spray into both nostrils 2 (two) times daily.  Dispense: 30  mL; Refill: 12  5. Stress at home We discussed consideration for counseling services versus adding buspirone.  Patient declines either of these today but will consider them.  She will follow-up PRN or in 3 months.  6. Anxiety Continue Zoloft 50 mg daily for now.   Orders Placed This Encounter  Procedures  . Ambulatory referral to Cardiology    Referral Priority:   Routine    Referral Type:   Consultation    Referral Reason:   Specialty Services Required    Requested Specialty:   Cardiology    Number of Visits Requested:   1   Meds ordered this encounter  Medications  . scopolamine (TRANSDERM-SCOP, 1.5 MG,) 1 MG/3DAYS    Sig: Place 1 patch (1.5 mg total) onto the skin every 3 (three) days. ( for sea sickness)    Dispense:  4 patch    Refill:  0  . azelastine (ASTELIN) 0.1 % nasal spray    Sig: Place 1 spray into both nostrils 2 (two) times daily.    Dispense:  30 mL    Refill:  [place], [place] [phone]

## 2018-11-23 ENCOUNTER — Ambulatory Visit: Payer: 59 | Admitting: Pediatrics

## 2018-11-28 ENCOUNTER — Ambulatory Visit: Payer: 59 | Admitting: Family Medicine

## 2019-01-06 ENCOUNTER — Encounter: Payer: Self-pay | Admitting: Family Medicine

## 2019-02-11 ENCOUNTER — Other Ambulatory Visit: Payer: Self-pay | Admitting: Pediatrics

## 2019-02-11 ENCOUNTER — Other Ambulatory Visit: Payer: Self-pay | Admitting: Family Medicine

## 2019-02-11 DIAGNOSIS — I10 Essential (primary) hypertension: Secondary | ICD-10-CM

## 2019-05-10 ENCOUNTER — Other Ambulatory Visit: Payer: Self-pay | Admitting: Family Medicine

## 2019-05-10 DIAGNOSIS — I10 Essential (primary) hypertension: Secondary | ICD-10-CM

## 2019-05-12 ENCOUNTER — Encounter: Payer: Self-pay | Admitting: Nurse Practitioner

## 2019-05-12 ENCOUNTER — Other Ambulatory Visit: Payer: Self-pay

## 2019-05-12 ENCOUNTER — Ambulatory Visit (INDEPENDENT_AMBULATORY_CARE_PROVIDER_SITE_OTHER): Payer: 59 | Admitting: Nurse Practitioner

## 2019-05-12 DIAGNOSIS — N3000 Acute cystitis without hematuria: Secondary | ICD-10-CM | POA: Diagnosis not present

## 2019-05-12 MED ORDER — CIPROFLOXACIN HCL 500 MG PO TABS: 500.0000 mg | ORAL_TABLET | Freq: Two times a day (BID) | ORAL | 0 refills | Status: DC

## 2019-05-12 NOTE — Progress Notes (Signed)
   Virtual Visit via telephone Note Due to COVID-19 pandemic this visit was conducted virtually. This visit type was conducted due to national recommendations for restrictions regarding the COVID-19 Pandemic (e.g. social distancing, sheltering in place) in an effort to limit this patient's exposure and mitigate transmission in our community. All issues noted in this document were discussed and addressed.  A physical exam was not performed with this format.  I connected with Samantha Olson on 05/12/19 at 2:10 by telephone and verified that I am speaking with the correct person using two identifiers. Samantha Olson is currently located at home and no one is currently with her during visit. The provider, Mary-Margaret Hassell Done, FNP is located in their office at time of visit.  I discussed the limitations, risks, security and privacy concerns of performing an evaluation and management service by telephone and the availability of in person appointments. I also discussed with the patient that there may be a patient responsible charge related to this service. The patient expressed understanding and agreed to proceed.  * atttempted to call 1:55- no answer- left message  History and Present Illness:   Chief Complaint: Urinary Tract Infection   HPI Patient calls in today c/o dysuria, frequency and urgency. This started last night. Urine smells foul today and has been voiding frequently.     Review of Systems  Genitourinary: Positive for dysuria, frequency and urgency.  All other systems reviewed and are negative.    Observations/Objective: Alert and oriented- answers all questions appropriately No distress noted in voice  Assessment and Plan: Samantha Olson in today with chief complaint of Urinary Tract Infection   1. Acute cystitis without hematuria Take medication as prescribe Cotton underwear Take shower not bath Cranberry juice, yogurt Force fluids AZO over the counter X2 days RTO prn   Meds ordered this encounter  Medications  . ciprofloxacin (CIPRO) 500 MG tablet    Sig: Take 1 tablet (500 mg total) by mouth 2 (two) times daily.    Dispense:  10 tablet    Refill:  0    Order Specific Question:   Supervising Provider    Answer:   Caryl Pina A [[id number]]       Follow Up Instructions: prn    I discussed the assessment and treatment plan with the patient. The patient was provided an opportunity to ask questions and all were answered. The patient agreed with the plan and demonstrated an understanding of the instructions.   The patient was advised to call back or seek an in-person evaluation if the symptoms worsen or if the condition fails to improve as anticipated.  The above assessment and management plan was discussed with the patient. The patient verbalized understanding of and has agreed to the management plan. Patient is aware to call the clinic if symptoms persist or worsen. Patient is aware when to return to the clinic for a follow-up visit. Patient educated on when it is appropriate to go to the emergency department.   Time call ended:  2:17  I provided 7 minutes of non-face-to-face time during this encounter.    Mary-Margaret Hassell Done, FNP

## 2019-06-12 ENCOUNTER — Other Ambulatory Visit: Payer: Self-pay | Admitting: Family Medicine

## 2019-06-12 DIAGNOSIS — I10 Essential (primary) hypertension: Secondary | ICD-10-CM

## 2019-06-12 NOTE — Telephone Encounter (Signed)
[name]. NTBS 30 days given 05/10/19

## 2019-06-14 ENCOUNTER — Telehealth: Payer: Self-pay | Admitting: Family Medicine

## 2019-06-14 DIAGNOSIS — I10 Essential (primary) hypertension: Secondary | ICD-10-CM

## 2019-06-14 MED ORDER — LOSARTAN POTASSIUM 100 MG PO TABS: 100.0000 mg | ORAL_TABLET | Freq: Every day | ORAL | 0 refills | Status: DC

## 2019-06-14 MED ORDER — HYDROCHLOROTHIAZIDE 12.5 MG PO TABS: 12.5000 mg | ORAL_TABLET | Freq: Every day | ORAL | 0 refills | Status: DC

## 2019-06-14 NOTE — Telephone Encounter (Signed)
Thirty day supply of Hctz and Losartan sent to [place]

## 2019-06-16 ENCOUNTER — Encounter: Payer: Self-pay | Admitting: Family Medicine

## 2019-06-16 ENCOUNTER — Ambulatory Visit (INDEPENDENT_AMBULATORY_CARE_PROVIDER_SITE_OTHER): Payer: BC Managed Care – PPO | Admitting: Family Medicine

## 2019-06-16 ENCOUNTER — Other Ambulatory Visit: Payer: Self-pay

## 2019-06-16 VITALS — BP 127/77 | HR 87 | Temp 99.4°F | Ht 69.0 in | Wt 196.0 lb

## 2019-06-16 DIAGNOSIS — Z23 Encounter for immunization: Secondary | ICD-10-CM

## 2019-06-16 DIAGNOSIS — M19042 Primary osteoarthritis, left hand: Secondary | ICD-10-CM

## 2019-06-16 DIAGNOSIS — R7303 Prediabetes: Secondary | ICD-10-CM | POA: Diagnosis not present

## 2019-06-16 DIAGNOSIS — F4321 Adjustment disorder with depressed mood: Secondary | ICD-10-CM | POA: Diagnosis not present

## 2019-06-16 DIAGNOSIS — M19041 Primary osteoarthritis, right hand: Secondary | ICD-10-CM

## 2019-06-16 DIAGNOSIS — F339 Major depressive disorder, recurrent, unspecified: Secondary | ICD-10-CM

## 2019-06-16 DIAGNOSIS — M62830 Muscle spasm of back: Secondary | ICD-10-CM

## 2019-06-16 DIAGNOSIS — I10 Essential (primary) hypertension: Secondary | ICD-10-CM | POA: Diagnosis not present

## 2019-06-16 LAB — BAYER DCA HB A1C WAIVED: HB A1C (BAYER DCA - WAIVED): 6.2 % (ref <7.0)

## 2019-06-16 MED ORDER — LOSARTAN POTASSIUM 100 MG PO TABS: 100.0000 mg | ORAL_TABLET | Freq: Every day | ORAL | 3 refills | Status: DC

## 2019-06-16 MED ORDER — CYCLOBENZAPRINE HCL 5 MG PO TABS: 5.0000 mg | ORAL_TABLET | Freq: Three times a day (TID) | ORAL | 3 refills | Status: DC | PRN

## 2019-06-16 MED ORDER — SERTRALINE HCL 100 MG PO TABS: 50.0000 mg | ORAL_TABLET | Freq: Every day | ORAL | 3 refills | Status: DC

## 2019-06-16 MED ORDER — LORAZEPAM 0.5 MG PO TABS: 0.2500 mg | ORAL_TABLET | Freq: Every evening | ORAL | 0 refills | Status: DC | PRN

## 2019-06-16 NOTE — Telephone Encounter (Signed)
Patient has upcoming appointment.

## 2019-06-16 NOTE — Progress Notes (Signed)
Subjective: CC: depression/ anxiety; arthritis; HTN PCP: [name], DO DPO:EUMP K Ikner is a 68 y.o. female presenting to clinic today for:  1.  Depression/anxiety Patient reports the recent loss of Samantha Olson husband due to COVID-19 in July.  Samantha Olson notes that Samantha Olson whole family got infected with it but unfortunately he passed away from a major stroke.  Samantha Olson has been compliant with Zoloft 50 mg daily.  Samantha Olson notes that Samantha Olson has several nights where Samantha Olson finds it very difficult to fall asleep due to significant anxiety.  Samantha Olson does feel like Samantha Olson has a good social support at home and notes that 3 of Samantha Olson children and a grandchild live with Samantha Olson currently.  Samantha Olson does not want to seek counseling because Samantha Olson does not like talking about this.  2.  Low back pain/osteoarthritis Patient with longstanding history of intermittent low back pain that seems to be precipitated by certain activities.  Patient occasionally takes a Flexeril to help with the spasm in Samantha Olson back when the heating pads do not help.  Samantha Olson also has arthritis in Samantha Olson hands and notes that it seems worse after certain activities.  Samantha Olson has used Tylenol and ibuprofen intermittently but wonders if there is something better out there for this.  3.  Hypertension Patient reports compliance with losartan 100 mg and hydrochlorothiazide 12.5 mg daily.  Samantha Olson notes that Samantha Olson dermatologist recently told Samantha Olson that the hydrochlorothiazide was linked to squamous cell carcinoma.  Patient reports personal history of squamous cell carcinoma and basal carcinoma x2.   ROS: Per HPI  Allergies  Allergen Reactions  . Ace Inhibitors Cough  . Celexa [Citalopram Hydrobromide]     Shakes- chest and arms  . Penicillins Swelling   Past Medical History:  Diagnosis Date  . Cataract   . Chronic kidney disease 1977   hx ofbladder tumor and cyst on kidney  . GERD (gastroesophageal reflux disease)   . H. pylori infection   . Hiatal hernia   . Hyperlipidemia   . Hypertension    . Vitamin D deficiency     Current Outpatient Medications:  .  azelastine (ASTELIN) 0.1 % nasal spray, Place 1 spray into both nostrils 2 (two) times daily., Disp: 30 mL, Rfl: 12 .  Cholecalciferol (VITAMIN D) 2000 UNITS CAPS, Take by mouth. 1 daily  , Disp: , Rfl:  .  ciprofloxacin (CIPRO) 500 MG tablet, Take 1 tablet (500 mg total) by mouth 2 (two) times daily., Disp: 10 tablet, Rfl: 0 .  cyclobenzaprine (FLEXERIL) 5 MG tablet, Take 1 tablet (5 mg total) by mouth 3 (three) times daily as needed. for muscle spams, Disp: 30 tablet, Rfl: 3 .  hydrochlorothiazide (HYDRODIURIL) 12.5 MG tablet, Take 1 tablet (12.5 mg total) by mouth daily. (Please make Aug 6 mos ckup), Disp: 30 tablet, Rfl: 0 .  losartan (COZAAR) 100 MG tablet, Take 1 tablet (100 mg total) by mouth daily. (Please make Aug 6 mos ckup), Disp: 30 tablet, Rfl: 0 .  meclizine (ANTIVERT) 25 MG tablet, Take 1 tablet (25 mg total) by mouth 2 (two) times daily as needed for dizziness or nausea., Disp: 20 tablet, Rfl: 0 .  scopolamine (TRANSDERM-SCOP, 1.5 MG,) 1 MG/3DAYS, Place 1 patch (1.5 mg total) onto the skin every 3 (three) days. ( for sea sickness), Disp: 4 patch, Rfl: 0 .  sertraline (ZOLOFT) 100 MG tablet, Take 0.5 tablets (50 mg total) by mouth daily., Disp: 90 tablet, Rfl: 0 Social History   Socioeconomic History  .  Marital status: Married    Spouse name: Not on file  . Number of children: Not on file  . Years of education: Not on file  . Highest education level: Not on file  Occupational History  . Not on file  Social Needs  . Financial resource strain: Not on file  . Food insecurity    Worry: Not on file    Inability: Not on file  . Transportation needs    Medical: Not on file    Non-medical: Not on file  Tobacco Use  . Smoking status: Never Smoker  . Smokeless tobacco: Never Used  Substance and Sexual Activity  . Alcohol use: Yes    Comment: rare use  . Drug use: No  . Sexual activity: Not on file  Lifestyle   . Physical activity    Days per week: Not on file    Minutes per session: Not on file  . Stress: Not on file  Relationships  . Social Herbalist on phone: Not on file    Gets together: Not on file    Attends religious service: Not on file    Active member of club or organization: Not on file    Attends meetings of clubs or organizations: Not on file    Relationship status: Not on file  . Intimate partner violence    Fear of current or ex partner: Not on file    Emotionally abused: Not on file    Physically abused: Not on file    Forced sexual activity: Not on file  Other Topics Concern  . Not on file  Social History Narrative  . Not on file   Family History  Problem Relation Age of Onset  . Cancer Father        stomach cancer  . Hypertension Mother   . Hyperlipidemia Mother   . Stroke Maternal Grandmother   . Hypertension Maternal Grandmother     Objective: Office vital signs reviewed. BP 127/77   Pulse 87   Temp 99.4 F (37.4 C) (Temporal)   Ht _0  (1.753 m)   Wt 196 lb (88.9 kg)   SpO2 97%   BMI 28.94 kg/m   Physical Examination:  General: Awake, alert, well nourished, tearful HEENT: Normal, sclera injected Cardio: regular rate and rhythm, S1S2 heard, no murmurs appreciated Pulm: clear to auscultation bilaterally, no wheezes, rhonchi or rales; normal work of breathing on room air Extremities: warm, well perfused, No edema, cyanosis or clubbing; +2 pulses bilaterally MSK: Ambulates independently.  Has full active range of motion.  No gross joint swelling or deformity. Psych: Mood depressed.  Patient tearful.  Eye contact fair. Depression screen [facility] 2/9 06/16/2019 11/16/2018 08/22/2018  Decreased Interest 2 0 0  Down, Depressed, Hopeless 2 1 0  PHQ - 2 Score 4 1 0  Altered sleeping 2 0 -  Tired, decreased energy 1 0 -  Change in appetite 1 0 -  Feeling bad or failure about yourself  0 0 -  Trouble concentrating 1 0 -  Moving slowly or  fidgety/restless 0 0 -  Suicidal thoughts 0 0 -  PHQ-9 Score 9 1 -  Difficult doing work/chores Somewhat difficult - -   GAD 7 : Generalized Anxiety Score 06/16/2019 11/16/2018 07/28/2017  Nervous, Anxious, on Edge _1 Control/stop worrying 0 2 3  Worry too much - different things 0 2 3  Trouble relaxing _2 Restless 0 0 0  Easily annoyed or irritable _0 Afraid - awful might happen 1 0 3  Total GAD 7 Score _1 Anxiety Difficulty Somewhat difficult Somewhat difficult -   Assessment/ Plan: 68 y.o. female   1. Essential hypertension Controlled.  Continue current regimen.  Okay to discontinue hydrochlorothiazide given history of squamous cell carcinoma.  Samantha Olson is to monitor blood pressures daily and report back to me in 3 weeks with blood pressure log.  Goal less than 150/90.  Check renal function and lipid panel - CMP14+EGFR - Lipid Panel - losartan (COZAAR) 100 MG tablet; Take 1 tablet (100 mg total) by mouth daily.  Dispense: 30 tablet; Refill: 3  2. Pre-diabetes - Bayer DCA Hb A1c Waived  3. Depression, recurrent ([place]) Not well controlled and likely exacerbated by grief reaction.  Continue Zoloft 50 mg daily.  We discussed potentially increasing this to 100 mg daily if ongoing symptoms or worsening symptoms.  I have added Ativan 0.5 to take 1/2-1 full tablet nightly as needed.  We discussed the addictive nature of this medication.  If Samantha Olson needs refills Samantha Olson will need to be seen in the office.  The national narcotic database was reviewed and there were no red flags. - LORazepam (ATIVAN) 0.5 MG tablet; Take 0.5-1 tablets (0.25-0.5 mg total) by mouth at bedtime as needed for anxiety.  Dispense: 30 tablet; Refill: 0 - sertraline (ZOLOFT) 100 MG tablet; Take 0.5 tablets (50 mg total) by mouth daily.  Dispense: 45 tablet; Refill: 3  4. Grief reaction I offered Samantha Olson referral to clinical social work for counseling services but patient declined.  Samantha Olson reports good supportive system at  home - LORazepam (ATIVAN) 0.5 MG tablet; Take 0.5-1 tablets (0.25-0.5 mg total) by mouth at bedtime as needed for anxiety.  Dispense: 30 tablet; Refill: 0  5. Low back pain Stable and relieved with intermittent use of Flexeril. - cyclobenzaprine (FLEXERIL) 5 MG tablet; Take 1 tablet (5 mg total) by mouth 3 (three) times daily as needed. for muscle spams  Dispense: 30 tablet; Refill: 3  6. Primary osteoarthritis of both hands Patient will start scheduled Tylenol and/or addition of Voltaren gel.  We discussed the potential interaction of oral NSAIDs with Samantha Olson SSRI.  Samantha Olson voiced good understanding.   No orders of the defined types were placed in this encounter.  No orders of the defined types were placed in this encounter.    [name], DO [place] [phone]

## 2019-06-16 NOTE — Patient Instructions (Signed)
I have given you a small supply of ativan to have on hand IF needed for severe panic/ anxiety.  This can cause sleepiness and impairment in judgment.  It is addictive.  Use sparingly.  If you need a refill, please see me in office.  We discussed seeing a counselor.  Nicki Reaper, is a Social worker in our office and could talk to you by phone if you are interested.    Controlled Substance Guidelines:  1. You cannot get an early refill, even it is lost.  2. You cannot get controlled medications from any other doctor, unless it is the emergency department and related to a new problem or injury.  3. You cannot use alcohol, marijuana, cocaine or any other recreational drugs while using this medication. This is very dangerous.  4. You are willing to have your urine drug tested at each visit.  5. You will not drive while using this medication, because that can put yourself and others in serious danger of an accident. 6. If any medication is stolen, then there must be a police report to verify it, or it cannot be refilled.  7. I will not prescribe these medications for longer than 3 months.  8. You must bring your pill bottle to each visit.  9. You must use the same pharmacy for all refills for the medication, unless you clear it with me beforehand.  10. You cannot share or sell this medication.    Complicated Grief Grief is a normal response to the death of someone close to you. Feelings of fear, anger, and guilt can affect almost everyone who loses a loved one. It is also common to have symptoms of depression while you are grieving. These include problems with sleep, loss of appetite, and lack of energy. They may last for weeks or months after a loss. Complicated grief is different from normal grief or depression. Normal grieving involves sadness and feelings of loss, but those feelings get better and heal over time. Complicated grief is a severe type of grief that lasts for a long time, usually for several  months to a year or longer. It interferes with your ability to function normally. Complicated grief may require treatment from a mental health care provider. What are the causes? The cause of this condition is not known. It is not clear why some people continue to struggle with grief and others do not. What increases the risk? You are more likely to develop this condition if:  The death of your loved one was sudden or unexpected.  The death of your loved one was due to a violent event.  Your loved one died from suicide.  Your loved one was a child or a young person.  You were very close to your loved one, or you were dependent on him or her.  You have a history of depression or anxiety. What are the signs or symptoms? Symptoms of this condition include:  Feeling disbelief or having a lack of emotion (numbness).  Being unable to enjoy good memories of your loved one.  Needing to avoid anything or anyone that reminds you of your loved one.  Being unable to stop thinking about the death.  Feeling intense anger or guilt.  Feeling alone and hopeless.  Feeling that your life is meaningless and empty.  Losing the desire to move on with your life. How is this diagnosed? This condition may be diagnosed based on:  Your symptoms. Complicated grief will be diagnosed if you have  ongoing symptoms of grief for 6-12 months or longer.  The effect of symptoms on your life. You may be diagnosed with this condition if your symptoms are interfering with your ability to live your life. Your health care provider may recommend that you see a mental health care provider. Many symptoms of depression are similar to the symptoms of complicated grief. It is important to be evaluated for complicated grief along with other mental health conditions. How is this treated? This condition is most commonly treated with talk therapy. This therapy is offered by a mental health specialist (psychiatrist). During  therapy:  You will learn healthy ways to cope with the loss of your loved one.  Your mental health care provider may recommend antidepressant medicines. Follow these instructions at home: Lifestyle   Take care of yourself. ? Eat on a regular basis, and maintain a healthy diet. Eat plenty of fruits, vegetables, lean protein, and whole grains. ? Try to get some exercise each day. Aim for 30 minutes of exercise on most days of the week. ? Keep a consistent sleep schedule. Try to get 8 or more hours of sleep each night. ? Start doing the things that you used to enjoy.  Do not use drugs or alcohol to ease your symptoms.  Spend time with friends and loved ones. General instructions  Take over-the-counter and prescription medicines only as told by your health care provider.  Consider joining a grief (bereavement) support group to help you deal with your loss.  Keep all follow-up visits as told by your health care provider. This is important. Contact a health care provider if:  Your symptoms prevent you from functioning normally.  Your symptoms do not get better with treatment. Get help right away if:  You have serious thoughts about hurting yourself or someone else.  You have suicidal feelings. If you ever feel like you may hurt yourself or others, or have thoughts about taking your own life, get help right away. You can go to your nearest emergency department or call:  Your local emergency services (911 in the [place]  A suicide crisis helpline, such as the [facility] at 8147792107. This is open 24 hours a day. Summary  Complicated grief is a severe type of grief that lasts for a long time. This grief is not likely to go away on its own. Get the help you need.  Some griefs are more difficult than others and can cause this condition. You may need a certain type of treatment to help you recover if the loss of your loved one was sudden, violent, or due  to suicide.  You may feel guilty about moving on with your life. Getting help does not mean that you are forgetting your loved one. It means that you are taking care of yourself.  Complicated grief is best treated with talk therapy. Medicines may also be prescribed.  Seek the help you need, and find support that will help you recover. This information is not intended to replace advice given to you by your health care provider. Make sure you discuss any questions you have with your health care provider. Document Released: 09/28/2005 Document Revised: 09/10/2017 Document Reviewed: 07/14/2017 Elsevier Patient Education  2020 [employer].

## 2019-06-17 LAB — LIPID PANEL
Chol/HDL Ratio: 3.3 ratio (ref 0.0–4.4)
Cholesterol, Total: 210 mg/dL — ABNORMAL HIGH (ref 100–199)
HDL: 63 mg/dL (ref >39)
LDL Chol Calc (NIH): 101 mg/dL — ABNORMAL HIGH (ref 0–99)
Triglycerides: 271 mg/dL — ABNORMAL HIGH (ref 0–149)
VLDL Cholesterol Cal: 46 mg/dL — ABNORMAL HIGH (ref 5–40)

## 2019-06-17 LAB — CMP14+EGFR
ALT: 18 IU/L (ref 0–32)
AST: 17 IU/L (ref 0–40)
Albumin/Globulin Ratio: 2.3 — ABNORMAL HIGH (ref 1.2–2.2)
Albumin: 4.4 g/dL (ref 3.8–4.8)
Alkaline Phosphatase: 64 IU/L (ref 39–117)
BUN/Creatinine Ratio: 31 — ABNORMAL HIGH (ref 12–28)
BUN: 31 mg/dL — ABNORMAL HIGH (ref 8–27)
Bilirubin Total: 0.3 mg/dL (ref 0.0–1.2)
CO2: 24 mmol/L (ref 20–29)
Calcium: 10.2 mg/dL (ref 8.7–10.3)
Chloride: 103 mmol/L (ref 96–106)
Creatinine, Ser: 0.99 mg/dL (ref 0.57–1.00)
GFR calc Af Amer: 68 mL/min/{1.73_m2} (ref >59)
GFR calc non Af Amer: 59 mL/min/{1.73_m2} — ABNORMAL LOW (ref >59)
Globulin, Total: 1.9 g/dL (ref 1.5–4.5)
Glucose: 114 mg/dL — ABNORMAL HIGH (ref 65–99)
Potassium: 3.9 mmol/L (ref 3.5–5.2)
Sodium: 141 mmol/L (ref 134–144)
Total Protein: 6.3 g/dL (ref 6.0–8.5)

## 2019-08-07 ENCOUNTER — Other Ambulatory Visit: Payer: Self-pay | Admitting: *Deleted

## 2019-08-07 DIAGNOSIS — F339 Major depressive disorder, recurrent, unspecified: Secondary | ICD-10-CM

## 2019-08-07 MED ORDER — SERTRALINE HCL 100 MG PO TABS: 50.0000 mg | ORAL_TABLET | Freq: Every day | ORAL | 3 refills | Status: DC

## 2019-08-22 ENCOUNTER — Telehealth: Payer: Self-pay | Admitting: Family Medicine

## 2019-08-22 NOTE — Telephone Encounter (Signed)
Patient has recently noticed swelling in her hands and burning in her toes.  She wants to see Dr. Lajuana Ripple only.  Scheduled in first available appointment on 08/29/19 at 8:15 am.

## 2019-08-28 ENCOUNTER — Other Ambulatory Visit: Payer: Self-pay

## 2019-08-29 ENCOUNTER — Ambulatory Visit (INDEPENDENT_AMBULATORY_CARE_PROVIDER_SITE_OTHER): Payer: BC Managed Care – PPO | Admitting: Family Medicine

## 2019-08-29 ENCOUNTER — Encounter: Payer: Self-pay | Admitting: Family Medicine

## 2019-08-29 VITALS — BP 137/82 | HR 73 | Temp 97.6°F | Ht 69.0 in | Wt 194.0 lb

## 2019-08-29 DIAGNOSIS — G5761 Lesion of plantar nerve, right lower limb: Secondary | ICD-10-CM | POA: Diagnosis not present

## 2019-08-29 DIAGNOSIS — M7989 Other specified soft tissue disorders: Secondary | ICD-10-CM

## 2019-08-29 MED ORDER — NAPROXEN 500 MG PO TABS: 500.0000 mg | ORAL_TABLET | Freq: Two times a day (BID) | ORAL | 0 refills | Status: DC

## 2019-08-29 NOTE — Progress Notes (Signed)
Subjective: CC: Hand swelling/toe burning PCP: Samantha Norlander, DO [id number] Samantha Olson is a 68 y.o. female presenting to clinic today for:  1.  Hand swelling Patient reports about a year history of intermittent hand swelling.  She points to the second MCP joints of bilateral hands as the area of most swelling.  Though she notes that the digits throughout the hands swell.  Denies any overt joint swelling of the DIP or PIP joints.  No increased warmth.  She works a lot with her hands in the garden.  She has used OTC NSAIDs with some improvement of the pain but no significant resolution of the swelling.  Denies any pedal edema.  She does report that the right second toe gets a burning sensation at the tip intermittently.  Denies preceding injury, change in color.  It is not currently symptomatic.   ROS: Per HPI  Allergies  Allergen Reactions  . Ace Inhibitors Cough  . Celexa [Citalopram Hydrobromide]     Shakes- chest and arms  . Penicillins Swelling   Past Medical History:  Diagnosis Date  . Cataract   . Chronic kidney disease 1977   hx ofbladder tumor and cyst on kidney  . GERD (gastroesophageal reflux disease)   . H. pylori infection   . Hiatal hernia   . Hyperlipidemia   . Hypertension   . Vitamin D deficiency     Current Outpatient Medications:  .  Cholecalciferol (VITAMIN D) 2000 UNITS CAPS, Take by mouth. 1 daily  , Disp: , Rfl:  .  cyclobenzaprine (FLEXERIL) 5 MG tablet, Take 1 tablet (5 mg total) by mouth 3 (three) times daily as needed. for muscle spams, Disp: 30 tablet, Rfl: 3 .  LORazepam (ATIVAN) 0.5 MG tablet, Take 0.5-1 tablets (0.25-0.5 mg total) by mouth at bedtime as needed for anxiety., Disp: 30 tablet, Rfl: 0 .  losartan (COZAAR) 100 MG tablet, Take 1 tablet (100 mg total) by mouth daily., Disp: 30 tablet, Rfl: 3 .  sertraline (ZOLOFT) 100 MG tablet, Take 0.5 tablets (50 mg total) by mouth daily., Disp: 45 tablet, Rfl: 3 .  naproxen (NAPROSYN) 500 MG  tablet, Take 1 tablet (500 mg total) by mouth 2 (two) times daily with a meal. (prn pain/ swelling), Disp: 60 tablet, Rfl: 0 Social History   Socioeconomic History  . Marital status: Married    Spouse name: Not on file  . Number of children: Not on file  . Years of education: Not on file  . Highest education level: Not on file  Occupational History  . Not on file  Social Needs  . Financial resource strain: Not on file  . Food insecurity    Worry: Not on file    Inability: Not on file  . Transportation needs    Medical: Not on file    Non-medical: Not on file  Tobacco Use  . Smoking status: Never Smoker  . Smokeless tobacco: Never Used  Substance and Sexual Activity  . Alcohol use: Yes    Comment: rare use  . Drug use: No  . Sexual activity: Not Currently  Lifestyle  . Physical activity    Days per week: Not on file    Minutes per session: Not on file  . Stress: Not on file  Relationships  . Social Herbalist on phone: Not on file    Gets together: Not on file    Attends religious service: Not on file    Active member  of club or organization: Not on file    Attends meetings of clubs or organizations: Not on file    Relationship status: Not on file  . Intimate partner violence    Fear of current or ex partner: Not on file    Emotionally abused: Not on file    Physically abused: Not on file    Forced sexual activity: Not on file  Other Topics Concern  . Not on file  Social History Narrative  . Not on file   Family History  Problem Relation Age of Onset  . Cancer Father        stomach cancer  . Hypertension Mother   . Hyperlipidemia Mother   . Stroke Maternal Grandmother   . Hypertension Maternal Grandmother     Objective: Office vital signs reviewed. BP 137/82   Pulse 73   Temp 97.6 F (36.4 C) (Temporal)   Ht 5\' 9"  (1.753 m)   Wt 194 lb (88 kg)   BMI 28.65 kg/m   Physical Examination:  General: Awake, alert, well nourished, No acute  distress Extremities: warm, well perfused, trace soft tissue edema noted over the second MCP joint on the right and mildly on the left.no cyanosis or clubbing; +2 pulses bilaterally MSK:  Hands: She has full active range of motion.  No tenderness to palpation over the joints.  No increased warmth or gross joint swelling.  She does have soft tissue swelling noted over the MCP of the second digit bilaterally with the right being worse than left.  Foot: Right second toe with no discoloration, bony abnormalities.  No tenderness to palpation. Skin: dry; intact; no rashes or lesions.  No palpable Morton's neuroma Neuro: Light touch and station grossly intact  Assessment/ Plan: 68 y.o. female   1. Morton's neuralgia, right I suspect she is having Morton's neuralgia given distribution of burning sensation.  Unlikely to be related to a vitamin deficiency or other metabolic pathology given unilateral nature and single digit affected.  I have recommended oral NSAIDs, metatarsal pad.  We discussed consideration for referral to sports medicine for orthotics if needed.  Handout was also provided giving more information on Morton's neuralgia - naproxen (NAPROSYN) 500 MG tablet; Take 1 tablet (500 mg total) by mouth 2 (two) times daily with a meal. (prn pain/ swelling)  Dispense: 60 tablet; Refill: 0  2. Bilateral hand swelling Likely due to underlying osteoarthritis.  Nothing on her exam to suggest a rheumatoid or autoimmune arthritis.  I recommended that she replace her over-the-counter NSAID with Naprosyn.  We discussed taking this with a meal and a full glass of water.  We discussed the risk of GI bleed with concomitant use of SSRI.  If no significant improvement, could consider referral for corticosteroid injection of the joint.  She was amenable with this plan and will contact me in 1 month if symptoms are refractory to oral NSAID trial and later Voltaren gel trial. - naproxen (NAPROSYN) 500 MG tablet; Take 1  tablet (500 mg total) by mouth 2 (two) times daily with a meal. (prn pain/ swelling)  Dispense: 60 tablet; Refill: 0   No orders of the defined types were placed in this encounter.  Meds ordered this encounter  Medications  . naproxen (NAPROSYN) 500 MG tablet    Sig: Take 1 tablet (500 mg total) by mouth 2 (two) times daily with a meal. (prn pain/ swelling)    Dispense:  60 tablet    Refill:  0  Samantha Norlander, DO [facility] [phone]

## 2019-08-29 NOTE — Patient Instructions (Signed)
You have been prescribed high-dose Naprosyn to take twice daily with a meal if needed for pain or inflammation in the hands.  Make sure that you take this with a full glass of water.  Take it at least 3 hours separately from your Zoloft as together they can increase risk of GI bleed and irritation.  If you do not find improvement after 2 weeks of use, switch over to the topical Voltaren gel.  You may use this up to 4 times daily.  If after 2 weeks of use of that you still have no improvement, please contact me and I will get you into orthopedics for consideration of corticosteroid injection.  I recommend a metatarsal pad to place under the area that I showed you of the foot to offset the pressure on the nerve to the second toe.  Again, if this does not fully relieve your symptoms, please contact me and we can get you into sports medicine for custom orthotics.  You have prescribed a nonsteroidal anti-inflammatory drug (NSAID) today. This will help with your pain and inflammation. Please do not take any other NSAIDs (ibuprofen/Motrin/Advil, naproxen/Aleve, meloxicam/Mobic, Voltaren/diclofenac). Please make sure to eat a meal when taking this medication.   Caution:  If you have a history of acid reflux/indigestion, I recommend that you take an antacid (such as Prilosec, Prevacid) daily while on the NSAID.  If you have a history of bleeding disorder, gastric ulcer, are on a blood thinner (like warfarin/Coumadin, Xarelto, Eliquis, etc) please do not take NSAID.  If you have ever had a heart attack, you should not take NSAIDs.  Morton Neuralgia  Morton neuralgia is foot pain that affects the ball of the foot and the area near the toes. Morton neuralgia occurs when part of a nerve in the foot (digital nerve) is under too much pressure (compressed). When this happens over a long period of time, the nerve can thicken (neuroma) and cause pain. Pain usually occurs between the third and fourth toes.  Morton  neuralgia can come and go but may get worse over time. What are the causes? This condition is caused by doing the same things over and over with your foot, such as:  Activities such as running or jumping.  Wearing shoes that are too tight. What increases the risk? You may be at higher risk for Morton neuralgia if you:  Are female.  Wear high heels.  Wear shoes that are narrow or tight.  Do activities that repeatedly stretch your toes, such as: ? Running. ? [place]. ? Long-distance walking. What are the signs or symptoms? The first symptom of Morton neuralgia is pain that spreads from the ball of the foot to the toes. It may feel like you are walking on a marble. Pain usually gets worse with walking and goes away at night. Other symptoms may include numbness and cramping of your toes. Both feet are equally affected, but rarely at the same time. How is this diagnosed? This condition is diagnosed based on your symptoms, your medical history, and a physical exam. Your health care provider may:  Squeeze your foot just behind your toe.  Ask you to move your toes to check for pain.  Ask about your physical activity level. You also may have imaging tests, such as an X-ray, ultrasound, or MRI. How is this treated? Treatment depends on how severe your condition is and what causes it. Treatment may involve:  Wearing different shoes that are not too tight, are low-heeled, and  provide good support. For some people, this is the only treatment needed.  Wearing an over-the-counter or custom supportive pad (orthotic) under the front of your foot.  Getting injections of numbing medicine and anti-inflammatory medicine (steroid) in the nerve.  Having surgery to remove part of the thickened nerve. Follow these instructions at home: Managing pain, stiffness, and swelling   Massage your foot as needed.  Wear orthotics as told by your health care provider.  If directed, put ice on your foot:  ? Put ice in a plastic bag. ? Place a towel between your skin and the bag. ? Leave the ice on for 20 minutes, 2-3 times a day.  Avoid activities that cause pain or make pain worse. If you play sports, ask your health care provider when it is safe for you to return to sports.  Raise (elevate) your foot above the level of your heart while lying down and, when possible, while sitting. General instructions  Take over-the-counter and prescription medicines only as told by your health care provider.  Do not drive or use heavy machinery while taking prescription pain medicine.  Wear shoes that: ? Have soft soles. ? Have a wide toe area. ? Provide arch support. ? Do not pinch or squeeze your feet. ? Have room for your orthotics, if applicable.  Keep all follow-up visits as told by your health care provider. This is important. Contact a health care provider if:  Your symptoms get worse or do not get better with treatment and home care. Summary  Morton neuralgia is foot pain that affects the ball of the foot and the area near the toes. Pain usually occurs between the third and fourth toes, gets worse with walking, and goes away at night.  Morton neuralgia occurs when part of a nerve in the foot (digital nerve) is under too much pressure. When this happens over a long period of time, the nerve can thicken (neuroma) and cause pain.  This condition is caused by doing the same things over and over with your foot, such as running or jumping, wearing shoes that are too tight, or wearing high heels.  Treatment may involve wearing low-heeled shoes that are not too tight, wearing a supportive pad (orthotic) under the front of your foot, getting injections in the nerve, or having surgery to remove part of the thickened nerve. This information is not intended to replace advice given to you by your health care provider. Make sure you discuss any questions you have with your health care provider.  Document Released: 01/04/2001 Document Revised: 10/12/2017 Document Reviewed: 10/12/2017 Elsevier Patient Education  2020 [employer].

## 2019-10-10 ENCOUNTER — Other Ambulatory Visit: Payer: Self-pay | Admitting: *Deleted

## 2019-10-10 DIAGNOSIS — I10 Essential (primary) hypertension: Secondary | ICD-10-CM

## 2019-10-10 MED ORDER — LOSARTAN POTASSIUM 100 MG PO TABS: 100.0000 mg | ORAL_TABLET | Freq: Every day | ORAL | 1 refills | Status: DC

## 2019-12-06 ENCOUNTER — Other Ambulatory Visit: Payer: Self-pay | Admitting: Family Medicine

## 2019-12-06 DIAGNOSIS — I10 Essential (primary) hypertension: Secondary | ICD-10-CM

## 2020-01-08 ENCOUNTER — Other Ambulatory Visit: Payer: Self-pay | Admitting: Family Medicine

## 2020-01-08 DIAGNOSIS — I10 Essential (primary) hypertension: Secondary | ICD-10-CM

## 2020-01-24 ENCOUNTER — Telehealth: Payer: Self-pay | Admitting: Family Medicine

## 2020-01-24 NOTE — Telephone Encounter (Signed)
Had COVID vaccine Saturday. Started abdomen pain Saturday night. But was dizzy right after the vaccine abdomen pain still going on. Denies vomiting and diarrhea. The only thing is the abdomen pain right now. Patient has normal BM. Denies fever. Please advise any thoughts on the matter.

## 2020-01-24 NOTE — Telephone Encounter (Signed)
Without an exam I am not sure what to make of her abdominal pain.  I recommend that she get checked out for this if this is severe.  There are multiple things that can cause abdominal pain including acute appendicitis, acute cholecystitis, colitis, gas and/or constipation.

## 2020-01-24 NOTE — Telephone Encounter (Signed)
Patient aware and verbalized understanding. °

## 2020-01-30 ENCOUNTER — Other Ambulatory Visit: Payer: Self-pay | Admitting: Family Medicine

## 2020-01-30 DIAGNOSIS — M62830 Muscle spasm of back: Secondary | ICD-10-CM

## 2020-02-03 ENCOUNTER — Other Ambulatory Visit: Payer: Self-pay | Admitting: Family Medicine

## 2020-02-03 DIAGNOSIS — M62830 Muscle spasm of back: Secondary | ICD-10-CM

## 2020-04-12 ENCOUNTER — Ambulatory Visit (INDEPENDENT_AMBULATORY_CARE_PROVIDER_SITE_OTHER): Payer: Medicare Other

## 2020-04-12 ENCOUNTER — Other Ambulatory Visit: Payer: Self-pay

## 2020-04-12 ENCOUNTER — Ambulatory Visit (INDEPENDENT_AMBULATORY_CARE_PROVIDER_SITE_OTHER): Payer: Medicare Other | Admitting: Physician Assistant

## 2020-04-12 ENCOUNTER — Encounter: Payer: Self-pay | Admitting: Physician Assistant

## 2020-04-12 VITALS — BP 127/70 | HR 65 | Temp 97.8°F | Ht 69.0 in | Wt 195.4 lb

## 2020-04-12 DIAGNOSIS — M79671 Pain in right foot: Secondary | ICD-10-CM | POA: Diagnosis not present

## 2020-04-12 DIAGNOSIS — I10 Essential (primary) hypertension: Secondary | ICD-10-CM

## 2020-04-12 MED ORDER — LOSARTAN POTASSIUM 100 MG PO TABS: 100.0000 mg | ORAL_TABLET | Freq: Every day | ORAL | 0 refills | Status: DC

## 2020-04-12 NOTE — Patient Instructions (Signed)
Stress Fracture  A stress fracture is a small break or crack in a bone. A stress fracture can be fully broken (complete) or partially broken (incomplete). The most common sites for stress fractures are the bones in the front of your feet (metatarsals), your heel (calcaneus), and the long bone of your lower leg (tibia). What are the causes? This condition is caused by overuse or repetitive exercise, such as running. It happens when a bone cannot absorb any more shock because the muscles around it are weak. Stress fractures happen most commonly when:  You rapidly increase or start a new physical activity.  You use shoes that are worn out or do not fit properly.  You exercise on a new surface. What increases the risk? You are more likely to develop this condition if:  You have a condition that causes weak bones (osteoporosis).  You are female. Stress fractures are more likely to occur in women. What are the signs or symptoms? The most common symptom of a stress fracture is feeling pain when you are using or putting weight on the affected part of your body. The pain usually improves when you are resting. Other symptoms may include:  Swelling of the affected area.  Pain in the area when it is touched. Stress fracture pain usually develops over time. How is this diagnosed? This condition may be diagnosed by:  Your symptoms.  Your medical history.  A physical exam.  Imaging tests, such as: ? X-rays. ? MRI. ? Bone scan. How is this treated? Treatment depends on the severity of your stress fracture. It is commonly treated with resting, icing, compression, and elevation (RICE therapy). Treatment may also include:  Medicines to reduce inflammation.  A cast or a walking shoe.  Crutches.  Surgery. This is usually only in severe cases. Follow these instructions at home: If you have a cast:  Do not put pressure on any part of the cast until it is fully hardened. This may take  several hours.  Do not stick anything inside the cast to scratch your skin. Doing that increases your risk of infection.  Check the skin around the cast every day. Tell your health care provider about any concerns.  You may put lotion on dry skin around the edges of the cast. Do not put lotion on the skin underneath the cast.  Keep the cast clean.  If the cast is not waterproof: ? Do not let it get wet. ? Cover it with a watertight covering when you take a bath or a shower. If you have a walking shoe:  Wear the shoe as told by your health care provider. Remove it only as told by your health care provider.  Loosen the shoe if your toes tingle, become numb, or turn cold and blue.  Keep the shoe clean.  If the shoe is not waterproof: ? Do not let it get wet. Managing pain, stiffness, and swelling   If directed, apply ice to the injured area: ? If you have a walking shoe, remove the shoe as told by your health care provider. ? Put ice in a plastic bag. ? Place a towel between your skin and the bag or between your cast and the bag. ? Leave the ice on for 20 minutes, 2-3 times per day.  Move your toes often to avoid stiffness and to lessen swelling.  Raise (elevate) the injured area above the level of your heart while you are sitting or lying down. Activity  Rest  as directed by your health care provider. Ask your health care provider if you may do alternative exercises, such as swimming or biking, while you are healing.  Return to your normal activities as directed by your health care provider. Ask your health care provider what activities are safe for you.  Perform range-of-motion exercises only as directed by your health care provider. General instructions  Do not use the injured limb to support yourbody weight until your health care provider says that you can. Use crutches if your health care provider tells you to do so.  Do not use any products that contain nicotine or  tobacco, such as cigarettes and e-cigarettes. These can delay bone healing. If you need help quitting, ask your health care provider.  Take over-the-counter and prescription medicines only as told by your health care provider.  Keep all follow-up visits as told by your health care provider. This is important. How is this prevented?  Only wear shoes that: ? Fit well. ? Are not worn out.  Eat a healthy diet that contains vitamin D and calcium. This helps keep your bones strong. Good sources of calcium and vitamin D include: ? Low-fat dairy products such as milk, yogurt, and cheese. ? Certain fish, such as fresh or canned salmon, tuna, and sardines. ? Products that have calcium and vitamin D added to them (fortified products), such as fortified cereals or juice.  Be careful when you start a new physical activity. Give your body time to adjust.  Avoid doing only one kind of activity. Do different exercises, such as swimming and running, so that no single part of your body gets overused.  Do strength-training exercises. Contact a health care provider if:  Your pain gets worse.  You have new symptoms.  You have increased swelling. Get help right away if:  You lose feeling in the injured area. Summary  A stress fracture is a small break or crack in a bone. A stress fracture can be fully broken (complete) or partially broken (incomplete).  This condition is caused by overuse or repetitive exercise, such as running.  The most common symptom of a stress fracture is feeling pain when you are using or putting weight on the affected part of your body.  Treatment depends on the severity of your stress fracture. This information is not intended to replace advice given to you by your health care provider. Make sure you discuss any questions you have with your health care provider. Document Revised: 11/09/2017 Document Reviewed: 11/09/2017 Elsevier Patient Education  2020 [employer].

## 2020-04-12 NOTE — Progress Notes (Signed)
  Subjective:     Patient ID: Samantha Olson, female   DOB: 04-15-1951, 69 y.o.   MRN: [id number]  HPI Pt with pain to the R foot for 2-3 months Denies any injury to the foot Pain is at the base of the 2nd toe Sx worse with weight bearing Currently using OTC meds for sx Review of Systems  Constitutional: Negative.   Musculoskeletal: Positive for arthralgias, gait problem and myalgias. Negative for joint swelling.  Skin: Negative.        Objective:   Physical Exam Vitals and nursing note reviewed.  Constitutional:      General: She is not in acute distress.    Appearance: Normal appearance.  Neurological:     Mental Status: She is alert.   No change in gait No ecchy, edema, induration noted to the R foot/toes Good pulses/sensory to the foot FROM of the toes + TTP mainly at the distal 2nd metatarsal Xray - see labs      Assessment:     1. Right foot pain    *   Plan:     Discussed the possibility of stress fx Nl course reviewed Heat/Ice OTC meds for sx Post op shoe recommended - rx sent F/U prn

## 2020-04-12 NOTE — Addendum Note (Signed)
Addended by: Lodema Pilot on: 04/12/2020 12:31 PM   Modules accepted: Orders

## 2020-06-10 ENCOUNTER — Other Ambulatory Visit: Payer: Self-pay

## 2020-06-10 ENCOUNTER — Encounter: Payer: Self-pay | Admitting: Family

## 2020-06-10 ENCOUNTER — Ambulatory Visit (INDEPENDENT_AMBULATORY_CARE_PROVIDER_SITE_OTHER): Payer: Medicare Other | Admitting: Family

## 2020-06-10 VITALS — BP 139/83 | HR 72 | Temp 98.0°F | Ht 69.0 in | Wt 195.0 lb

## 2020-06-10 DIAGNOSIS — J019 Acute sinusitis, unspecified: Secondary | ICD-10-CM | POA: Diagnosis not present

## 2020-06-10 MED ORDER — DOXYCYCLINE HYCLATE 100 MG PO TABS: 100.0000 mg | ORAL_TABLET | Freq: Two times a day (BID) | ORAL | 0 refills | Status: DC

## 2020-06-10 MED ORDER — FLUTICASONE PROPIONATE 50 MCG/ACT NA SUSP: 2.0000 | Freq: Every day | NASAL | 6 refills | Status: DC

## 2020-06-10 NOTE — Patient Instructions (Signed)
Sinusitis, Adult Sinusitis is inflammation of your sinuses. Sinuses are hollow spaces in the bones around your face. Your sinuses are located:  Around your eyes.  In the middle of your forehead.  Behind your nose.  In your cheekbones. Mucus normally drains out of your sinuses. When your nasal tissues become inflamed or swollen, mucus can become trapped or blocked. This allows bacteria, viruses, and fungi to grow, which leads to infection. Most infections of the sinuses are caused by a virus. Sinusitis can develop quickly. It can last for up to 4 weeks (acute) or for more than 12 weeks (chronic). Sinusitis often develops after a cold. What are the causes? This condition is caused by anything that creates swelling in the sinuses or stops mucus from draining. This includes:  Allergies.  Asthma.  Infection from bacteria or viruses.  Deformities or blockages in your nose or sinuses.  Abnormal growths in the nose (nasal polyps).  Pollutants, such as chemicals or irritants in the air.  Infection from fungi (rare). What increases the risk? You are more likely to develop this condition if you:  Have a weak body defense system (immune system).  Do a lot of swimming or diving.  Overuse nasal sprays.  Smoke. What are the signs or symptoms? The main symptoms of this condition are pain and a feeling of pressure around the affected sinuses. Other symptoms include:  Stuffy nose or congestion.  Thick drainage from your nose.  Swelling and warmth over the affected sinuses.  Headache.  Upper toothache.  A cough that may get worse at night.  Extra mucus that collects in the throat or the back of the nose (postnasal drip).  Decreased sense of smell and taste.  Fatigue.  A fever.  Sore throat.  Bad breath. How is this diagnosed? This condition is diagnosed based on:  Your symptoms.  Your medical history.  A physical exam.  Tests to find out if your condition is  acute or chronic. This may include: ? Checking your nose for nasal polyps. ? Viewing your sinuses using a device that has a light (endoscope). ? Testing for allergies or bacteria. ? Imaging tests, such as an MRI or CT scan. In rare cases, a bone biopsy may be done to rule out more serious types of fungal sinus disease. How is this treated? Treatment for sinusitis depends on the cause and whether your condition is chronic or acute.  If caused by a virus, your symptoms should go away on their own within 10 days. You may be given medicines to relieve symptoms. They include: ? Medicines that shrink swollen nasal passages (topical intranasal decongestants). ? Medicines that treat allergies (antihistamines). ? A spray that eases inflammation of the nostrils (topical intranasal corticosteroids). ? Rinses that help get rid of thick mucus in your nose (nasal saline washes).  If caused by bacteria, your health care provider may recommend waiting to see if your symptoms improve. Most bacterial infections will get better without antibiotic medicine. You may be given antibiotics if you have: ? A severe infection. ? A weak immune system.  If caused by narrow nasal passages or nasal polyps, you may need to have surgery. Follow these instructions at home: Medicines  Take, use, or apply over-the-counter and prescription medicines only as told by your health care provider. These may include nasal sprays.  If you were prescribed an antibiotic medicine, take it as told by your health care provider. Do not stop taking the antibiotic even if you start   to feel better. Hydrate and humidify   Drink enough fluid to keep your urine pale yellow. Staying hydrated will help to thin your mucus.  Use a cool mist humidifier to keep the humidity level in your home above 50%.  Inhale steam for 10-15 minutes, 3-4 times a day, or as told by your health care provider. You can do this in the bathroom while a hot shower is  running.  Limit your exposure to cool or dry air. Rest  Rest as much as possible.  Sleep with your head raised (elevated).  Make sure you get enough sleep each night. General instructions   Apply a warm, moist washcloth to your face 3-4 times a day or as told by your health care provider. This will help with discomfort.  Wash your hands often with soap and water to reduce your exposure to germs. If soap and water are not available, use hand sanitizer.  Do not smoke. Avoid being around people who are smoking (secondhand smoke).  Keep all follow-up visits as told by your health care provider. This is important. Contact a health care provider if:  You have a fever.  Your symptoms get worse.  Your symptoms do not improve within 10 days. Get help right away if:  You have a severe headache.  You have persistent vomiting.  You have severe pain or swelling around your face or eyes.  You have vision problems.  You develop confusion.  Your neck is stiff.  You have trouble breathing. Summary  Sinusitis is soreness and inflammation of your sinuses. Sinuses are hollow spaces in the bones around your face.  This condition is caused by nasal tissues that become inflamed or swollen. The swelling traps or blocks the flow of mucus. This allows bacteria, viruses, and fungi to grow, which leads to infection.  If you were prescribed an antibiotic medicine, take it as told by your health care provider. Do not stop taking the antibiotic even if you start to feel better.  Keep all follow-up visits as told by your health care provider. This is important. This information is not intended to replace advice given to you by your health care provider. Make sure you discuss any questions you have with your health care provider. Document Revised: 02/28/2018 Document Reviewed: 02/28/2018 Elsevier Patient Education  2020 [employer]  

## 2020-06-10 NOTE — Progress Notes (Signed)
Subjective:    Patient ID: Samantha Olson, female    DOB: 29-Jan-1951, 69 y.o.   MRN: [id number]  Chief Complaint  Patient presents with   Ear Pain    been going on for over a month. Patient taken mucenix, little t relief    Sinus Problem   Headache   Dizziness    Sinus Problem This is a new problem. The current episode started more than 1 month ago. The problem has been gradually worsening since onset. There has been no fever. Associated symptoms include congestion, ear pain, headaches, sinus pressure (left) and a sore throat. Pertinent negatives include no coughing or sneezing. Past treatments include oral decongestants. The treatment provided mild relief.  Headache  Associated symptoms include dizziness, ear pain, hearing loss, sinus pressure (left) and a sore throat. Pertinent negatives include no coughing.  Dizziness Associated symptoms include congestion, headaches and a sore throat. Pertinent negatives include no coughing.  Otalgia  There is pain in the left ear. This is a new problem. The current episode started more than 1 month ago. The problem occurs every few minutes. The problem has been waxing and waning. There has been no fever. The pain is at a severity of 4/10. The pain is mild. Associated symptoms include headaches, hearing loss and a sore throat. Pertinent negatives include no coughing or ear discharge. She has tried acetaminophen (Mucinex) for the symptoms. The treatment provided mild relief.      Review of Systems  HENT: Positive for congestion, ear pain, hearing loss, sinus pressure (left) and sore throat. Negative for ear discharge and sneezing.   Respiratory: Negative for cough.   Neurological: Positive for dizziness and headaches.  All other systems reviewed and are negative.      Objective:   Physical Exam Vitals reviewed.  Constitutional:      General: She is not in acute distress.    Appearance: She is well-developed.  HENT:     Head:  Normocephalic and atraumatic.     Right Ear: There is impacted cerumen.     Left Ear: Tympanic membrane normal.     Nose:     Left Turbinates: Enlarged.     Mouth/Throat:     Pharynx: Posterior oropharyngeal erythema present.  Eyes:     Pupils: Pupils are equal, round, and reactive to light.  Neck:     Thyroid: No thyromegaly.  Cardiovascular:     Rate and Rhythm: Normal rate and regular rhythm.     Heart sounds: Normal heart sounds. No murmur heard.   Pulmonary:     Effort: Pulmonary effort is normal. No respiratory distress.     Breath sounds: Normal breath sounds. No wheezing.  Abdominal:     General: Bowel sounds are normal. There is no distension.     Palpations: Abdomen is soft.     Tenderness: There is no abdominal tenderness.  Musculoskeletal:        General: No tenderness. Normal range of motion.     Cervical back: Normal range of motion and neck supple.  Skin:    General: Skin is warm and dry.  Neurological:     Mental Status: She is alert and oriented to person, place, and time.     Cranial Nerves: No cranial nerve deficit.     Deep Tendon Reflexes: Reflexes are normal and symmetric.  Psychiatric:        Behavior: Behavior normal.        Thought Content: Thought content normal.  Judgment: Judgment normal.       BP 139/83    Pulse 72    Temp 98 F (36.7 C) (Temporal)    Ht 5\' 9"  (1.753 m)    Wt 195 lb (88.5 kg)    SpO2 96%    BMI 28.80 kg/m      Assessment & Plan:  CHARLEY LAFRANCE comes in today with chief complaint of Ear Pain (been going on for over a month. Patient taken mucenix, little t relief ), Sinus Problem, Headache, and Dizziness   Diagnosis and orders addressed:  1. Acute sinusitis, recurrence not specified, unspecified location - Take meds as prescribed - Use a cool mist humidifier  -Use saline nose sprays frequently -Force fluids -For any cough or congestion  Use plain Mucinex- regular strength or max strength is fine -For fever or  aces or pains- take tylenol or ibuprofen. -Throat lozenges if help -RTO if symptoms worsen or do not improve  - doxycycline (VIBRA-TABS) 100 MG tablet; Take 1 tablet (100 mg total) by mouth 2 (two) times daily.  Dispense: 20 tablet; Refill: 0 - fluticasone (FLONASE) 50 MCG/ACT nasal spray; Place 2 sprays into both nostrils daily.  Dispense: 16 g; Refill: [place], FNP

## 2020-06-20 ENCOUNTER — Other Ambulatory Visit: Payer: Self-pay

## 2020-06-20 ENCOUNTER — Ambulatory Visit (INDEPENDENT_AMBULATORY_CARE_PROVIDER_SITE_OTHER): Payer: Medicare Other | Admitting: Nurse Practitioner

## 2020-06-20 ENCOUNTER — Encounter: Payer: Self-pay | Admitting: Nurse Practitioner

## 2020-06-20 VITALS — BP 137/84 | HR 67 | Temp 97.5°F | Ht 69.0 in | Wt 193.4 lb

## 2020-06-20 DIAGNOSIS — I10 Essential (primary) hypertension: Secondary | ICD-10-CM

## 2020-06-20 MED ORDER — LOSARTAN POTASSIUM 100 MG PO TABS: 100.0000 mg | ORAL_TABLET | Freq: Every day | ORAL | 1 refills | Status: DC

## 2020-06-20 NOTE — Patient Instructions (Signed)

## 2020-06-20 NOTE — Assessment & Plan Note (Signed)
Patient is a 69 year old female presents for follow-up of hypertension.  Patient was diagnosed in 2012.  Patient is tolerating medication well without side effects.  Compliance with treatment has been good.  Including taking medication as directed.  Maintains a healthy diet and regular exercise regimen.  And following up as directed.  Patient reports blood pressure is better at home than in clinic today.  Patient blood pressure today 158/86 repeated 137/84, current medication is 100 mg Cozaar. Provided patient with education, advised patient if blood pressures are elevated changes to medication dose is needed.  Patient reports currently taking dexamethasone-diphenhydramine which may be a source of elevated blood pressure in clinic.  Advised patient to take blood pressure log for 1 week after discontinuing medication and call in blood pressure log to clinic.   No changes to current medication will follow up with patient.

## 2020-06-20 NOTE — Progress Notes (Signed)
Established Patient Office Visit  Subjective:  Patient ID: Samantha Olson, female    DOB: 1951/03/16  Age: 69 y.o. MRN: [id number]  CC:  Chief Complaint  Patient presents with   Hypertension    check up of chronic medical conditions    HPI Samantha Olson presents for follow up of hypertension. Patient was diagnosed in _5/24/2012. The patient is tolerating the medication well without side effects. Compliance with treatment has been good; including taking medication as directed , maintains a healthy diet and regular exercise regimen , and following up as directed. Patient reports blood pressure is better at home than in clinic today. Current medication is 100 mg cozaar, patient currently reports taking doxycycline and dexamethasone-diphenhydramine, and attributes that to elevated blood pressure today.  Past Medical History:  Diagnosis Date   Cataract    Chronic kidney disease 1977   hx ofbladder tumor and cyst on kidney   GERD (gastroesophageal reflux disease)    H. pylori infection    Hiatal hernia    Hyperlipidemia    Hypertension    Vitamin D deficiency     Past Surgical History:  Procedure Laterality Date   Bladder tumor removed     JOINT REPLACEMENT Left 9/13   knee   skin ca removal      Family History  Problem Relation Age of Onset   Cancer Father        stomach cancer   Hypertension Mother    Hyperlipidemia Mother    Stroke Maternal Grandmother    Hypertension Maternal Grandmother     Social History   Socioeconomic History   Marital status: Married    Spouse name: Not on file   Number of children: Not on file   Years of education: Not on file   Highest education level: Not on file  Occupational History   Not on file  Tobacco Use   Smoking status: Never Smoker   Smokeless tobacco: Never Used  Vaping Use   Vaping Use: Never used  Substance and Sexual Activity   Alcohol use: Yes    Comment: rare use   Drug use: No   Sexual  activity: Not Currently  Other Topics Concern   Not on file  Social History Narrative   Not on file   Social Determinants of Health   Financial Resource Strain:    Difficulty of Paying Living Expenses: Not on file  Food Insecurity:    Worried About Running Out of Food in the Last Year: Not on file   [employer] of Food in the Last Year: Not on file  Transportation Needs:    Lack of Transportation (Medical): Not on file   Lack of Transportation (Non-Medical): Not on file  Physical Activity:    Days of Exercise per Week: Not on file   Minutes of Exercise per Session: Not on file  Stress:    Feeling of Stress : Not on file  Social Connections:    Frequency of Communication with Friends and Family: Not on file   Frequency of Social Gatherings with Friends and Family: Not on file   Attends Religious Services: Not on file   Active Member of Clubs or Organizations: Not on file   Attends Archivist Meetings: Not on file   Marital Status: Not on file  Intimate Partner Violence:    Fear of Current or Ex-Partner: Not on file   Emotionally Abused: Not on file   Physically Abused: Not on file  Sexually Abused: Not on file    Outpatient Medications Prior to Visit  Medication Sig Dispense Refill   Cholecalciferol (VITAMIN D) 2000 UNITS CAPS Take by mouth. 1 daily       cyclobenzaprine (FLEXERIL) 5 MG tablet Take 1 tablet by mouth three times daily as needed for muscle spasm 30 tablet 0   fluticasone (FLONASE) 50 MCG/ACT nasal spray Place 2 sprays into both nostrils daily. 16 g 6   losartan (COZAAR) 100 MG tablet Take 1 tablet (100 mg total) by mouth daily. 90 tablet 0   naproxen (NAPROSYN) 500 MG tablet Take 1 tablet (500 mg total) by mouth 2 (two) times daily with a meal. (prn pain/ swelling) 60 tablet 0   doxycycline (VIBRA-TABS) 100 MG tablet Take 1 tablet (100 mg total) by mouth 2 (two) times daily. 20 tablet 0   No facility-administered medications  prior to visit.    Allergies  Allergen Reactions   Ace Inhibitors Cough   Celexa [Citalopram Hydrobromide]     Shakes- chest and arms   Penicillins Swelling    ROS Review of Systems  HENT:       Stuffy nose per patient  Neurological: Positive for headaches.  All other systems reviewed and are negative.     Objective:    Physical Exam Vitals reviewed.  Constitutional:      Appearance: Normal appearance.  HENT:     Head: Normocephalic.     Nose:     Comments: Stuffy  Eyes:     Conjunctiva/sclera: Conjunctivae normal.  Cardiovascular:     Rate and Rhythm: Normal rate and regular rhythm.     Pulses: Normal pulses.     Heart sounds: Normal heart sounds.  Pulmonary:     Effort: Pulmonary effort is normal.     Breath sounds: Normal breath sounds.  Abdominal:     General: Bowel sounds are normal.  Musculoskeletal:        General: Normal range of motion.  Neurological:     Mental Status: She is alert and oriented to person, place, and time.     Comments: Slight headache  Psychiatric:        Mood and Affect: Mood normal.     BP 137/84    Pulse 67    Temp (!) 97.5 F (36.4 C) (Temporal)    Ht 5\' 9"  (1.753 m)    Wt 193 lb 6.4 oz (87.7 kg)    SpO2 100%    BMI 28.56 kg/m  Wt Readings from Last 3 Encounters:  06/20/20 193 lb 6.4 oz (87.7 kg)  06/10/20 195 lb (88.5 kg)  04/12/20 195 lb 6.4 oz (88.6 kg)     Health Maintenance Due  Topic Date Due   TETANUS/TDAP  12/14/2019   MAMMOGRAM  05/18/2020      Lab Results  Component Value Date   TSH 1.090 09/23/2015   Lab Results  Component Value Date   WBC 8.0 05/24/2017   HGB 13.6 05/24/2017   HCT 39.9 05/24/2017   MCV 86 05/24/2017   PLT 280 05/24/2017   Lab Results  Component Value Date   NA 141 06/16/2019   K 3.9 06/16/2019   CO2 24 06/16/2019   GLUCOSE 114 (H) 06/16/2019   BUN 31 (H) 06/16/2019   CREATININE 0.99 06/16/2019   BILITOT 0.3 06/16/2019   ALKPHOS 64 06/16/2019   AST 17 06/16/2019     ALT 18 06/16/2019   PROT 6.3 06/16/2019   ALBUMIN 4.4 06/16/2019  CALCIUM 10.2 06/16/2019   Lab Results  Component Value Date   CHOL 210 (H) 06/16/2019   Lab Results  Component Value Date   HDL 63 06/16/2019   Lab Results  Component Value Date   LDLCALC 101 (H) 06/16/2019   Lab Results  Component Value Date   TRIG 271 (H) 06/16/2019   Lab Results  Component Value Date   CHOLHDL 3.3 06/16/2019   Lab Results  Component Value Date   HGBA1C 6.2 06/16/2019      Assessment & Plan:   Problem List Items Addressed This Visit      Cardiovascular and Mediastinum   HTN (hypertension) - Primary (Chronic)    Patient is a 69 year old female presents for follow-up of hypertension.  Patient was diagnosed in 2012.  Patient is tolerating medication well without side effects.  Compliance with treatment has been good.  Including taking medication as directed.  Maintains a healthy diet and regular exercise regimen.  And following up as directed.  Patient reports blood pressure is better at home than in clinic today.  Patient blood pressure today 158/86 repeated 137/84, current medication is 100 mg Cozaar. Provided patient with education, advised patient if blood pressures are elevated changes to medication dose is needed.  Patient reports currently taking dexamethasone-diphenhydramine which may be a source of elevated blood pressure in clinic.  Advised patient to take blood pressure log for 1 week after discontinuing medication and call in blood pressure log to clinic.   No changes to current medication will follow up with patient.      Relevant Medications   losartan (COZAAR) 100 MG tablet      Meds ordered this encounter  Medications   losartan (COZAAR) 100 MG tablet    Sig: Take 1 tablet (100 mg total) by mouth daily.    Dispense:  90 tablet    Refill:  1    Follow-up: Return in about 4 weeks (around 07/18/2020) for yearly physical.    Ivy Lynn, NP

## 2020-07-08 ENCOUNTER — Other Ambulatory Visit: Payer: Self-pay | Admitting: Physician Assistant

## 2020-07-08 DIAGNOSIS — I10 Essential (primary) hypertension: Secondary | ICD-10-CM

## 2020-08-06 ENCOUNTER — Other Ambulatory Visit: Payer: Self-pay

## 2020-08-06 ENCOUNTER — Ambulatory Visit (INDEPENDENT_AMBULATORY_CARE_PROVIDER_SITE_OTHER): Payer: Medicare Other | Admitting: Family Medicine

## 2020-08-06 ENCOUNTER — Encounter: Payer: Self-pay | Admitting: Family Medicine

## 2020-08-06 VITALS — BP 133/77 | HR 71 | Temp 97.3°F | Ht 69.0 in | Wt 195.6 lb

## 2020-08-06 DIAGNOSIS — M62830 Muscle spasm of back: Secondary | ICD-10-CM

## 2020-08-06 DIAGNOSIS — Z23 Encounter for immunization: Secondary | ICD-10-CM | POA: Diagnosis not present

## 2020-08-06 DIAGNOSIS — F339 Major depressive disorder, recurrent, unspecified: Secondary | ICD-10-CM | POA: Diagnosis not present

## 2020-08-06 DIAGNOSIS — F439 Reaction to severe stress, unspecified: Secondary | ICD-10-CM | POA: Diagnosis not present

## 2020-08-06 DIAGNOSIS — J301 Allergic rhinitis due to pollen: Secondary | ICD-10-CM

## 2020-08-06 DIAGNOSIS — R7303 Prediabetes: Secondary | ICD-10-CM

## 2020-08-06 DIAGNOSIS — F4321 Adjustment disorder with depressed mood: Secondary | ICD-10-CM | POA: Diagnosis not present

## 2020-08-06 DIAGNOSIS — Z0000 Encounter for general adult medical examination without abnormal findings: Secondary | ICD-10-CM

## 2020-08-06 DIAGNOSIS — Z130 Encounter for screening for diseases of the blood and blood-forming organs and certain disorders involving the immune mechanism: Secondary | ICD-10-CM

## 2020-08-06 DIAGNOSIS — F4320 Adjustment disorder, unspecified: Secondary | ICD-10-CM

## 2020-08-06 DIAGNOSIS — E782 Mixed hyperlipidemia: Secondary | ICD-10-CM

## 2020-08-06 MED ORDER — SERTRALINE HCL 50 MG PO TABS: 50.0000 mg | ORAL_TABLET | Freq: Every day | ORAL | 3 refills | Status: DC

## 2020-08-06 MED ORDER — CYCLOBENZAPRINE HCL 5 MG PO TABS: 5.0000 mg | ORAL_TABLET | Freq: Three times a day (TID) | ORAL | 2 refills | Status: DC | PRN

## 2020-08-06 NOTE — Patient Instructions (Signed)
Claritin in the morning and 1/2 tablet of Zyrtec ok at night if needed for allergy symptoms.  Come in for fasting labs  We talked about counseling services.  I'd be glad to connect you if you desire going forward.  I think this would be very helpful.   Preventive Care 9 Years and Older, Female Preventive care refers to lifestyle choices and visits with your health care provider that can promote health and wellness. This includes:  A yearly physical exam. This is also called an annual well check.  Regular dental and eye exams.  Immunizations.  Screening for certain conditions.  Healthy lifestyle choices, such as diet and exercise. What can I expect for my preventive care visit? Physical exam Your health care provider will check:  Height and weight. These may be used to calculate body mass index (BMI), which is a measurement that tells if you are at a healthy weight.  Heart rate and blood pressure.  Your skin for abnormal spots. Counseling Your health care provider may ask you questions about:  Alcohol, tobacco, and drug use.  Emotional well-being.  Home and relationship well-being.  Sexual activity.  Eating habits.  History of falls.  Memory and ability to understand (cognition).  Work and work Statistician.  Pregnancy and menstrual history. What immunizations do I need?  Influenza (flu) vaccine  This is recommended every year. Tetanus, diphtheria, and pertussis (Tdap) vaccine  You may need a Td booster every 10 years. Varicella (chickenpox) vaccine  You may need this vaccine if you have not already been vaccinated. Zoster (shingles) vaccine  You may need this after age 59. Pneumococcal conjugate (PCV13) vaccine  One dose is recommended after age 56. Pneumococcal polysaccharide (PPSV23) vaccine  One dose is recommended after age 8. Measles, mumps, and rubella (MMR) vaccine  You may need at least one dose of MMR if you were born in 1957 or later.  You may also need a second dose. Meningococcal conjugate (MenACWY) vaccine  You may need this if you have certain conditions. Hepatitis A vaccine  You may need this if you have certain conditions or if you travel or work in places where you may be exposed to hepatitis A. Hepatitis B vaccine  You may need this if you have certain conditions or if you travel or work in places where you may be exposed to hepatitis B. Haemophilus influenzae type b (Hib) vaccine  You may need this if you have certain conditions. You may receive vaccines as individual doses or as more than one vaccine together in one shot (combination vaccines). Talk with your health care provider about the risks and benefits of combination vaccines. What tests do I need? Blood tests  Lipid and cholesterol levels. These may be checked every 5 years, or more frequently depending on your overall health.  Hepatitis C test.  Hepatitis B test. Screening  Lung cancer screening. You may have this screening every year starting at age 64 if you have a 30-pack-year history of smoking and currently smoke or have quit within the past 15 years.  Colorectal cancer screening. All adults should have this screening starting at age 26 and continuing until age 6. Your health care provider may recommend screening at age 49 if you are at increased risk. You will have tests every 1-10 years, depending on your results and the type of screening test.  Diabetes screening. This is done by checking your blood sugar (glucose) after you have not eaten for a while (fasting). You  may have this done every 1-3 years.  Mammogram. This may be done every 1-2 years. Talk with your health care provider about how often you should have regular mammograms.  BRCA-related cancer screening. This may be done if you have a family history of breast, ovarian, tubal, or peritoneal cancers. Other tests  Sexually transmitted disease (STD) testing.  Bone density scan.  This is done to screen for osteoporosis. You may have this done starting at age 48. Follow these instructions at home: Eating and drinking  Eat a diet that includes fresh fruits and vegetables, whole grains, lean protein, and low-fat dairy products. Limit your intake of foods with high amounts of sugar, saturated fats, and salt.  Take vitamin and mineral supplements as recommended by your health care provider.  Do not drink alcohol if your health care provider tells you not to drink.  If you drink alcohol: ? Limit how much you have to 0-1 drink a day. ? Be aware of how much alcohol is in your drink. In the [place] one drink equals one 12 oz bottle of beer (355 mL), one 5 oz glass of wine (148 mL), or one 1 oz glass of hard liquor (44 mL). Lifestyle  Take daily care of your teeth and gums.  Stay active. Exercise for at least 30 minutes on 5 or more days each week.  Do not use any products that contain nicotine or tobacco, such as cigarettes, e-cigarettes, and chewing tobacco. If you need help quitting, ask your health care provider.  If you are sexually active, practice safe sex. Use a condom or other form of protection in order to prevent STIs (sexually transmitted infections).  Talk with your health care provider about taking a low-dose aspirin or statin. What's next?  Go to your health care provider once a year for a well check visit.  Ask your health care provider how often you should have your eyes and teeth checked.  Stay up to date on all vaccines. This information is not intended to replace advice given to you by your health care provider. Make sure you discuss any questions you have with your health care provider. Document Revised: 09/22/2018 Document Reviewed: 09/22/2018 Elsevier Patient Education  2020 [employer].

## 2020-08-06 NOTE — Progress Notes (Signed)
Samantha Olson is a 69 y.o. female presents to office today for annual physical exam examination.    Concerns today include: 1.  Depression Patient reports that she did wean off of the Zoloft in June.  For months she was doing okay but symptoms have gradually returned and now have gone worse.  She reports decreased enjoyment in things it typically bring her happiness, particularly since the passing of her husband.  She reports increased stressors including a schizophrenic relative, who resides with her, and her son who has picked up drinking since the passing of his father.  She has not sought any counseling and is not sure that she is quite ready to do this.  She has been using beer at nighttime to relax her and get her to go to sleep.  She notes that she has muscle tension otherwise.  2.  Allergic rhinitis Patient reports itchy throat that makes her feel like she has to clear her throat often.  Denies any overt drainage.  No shortness of breath.  She is compliant with Claritin daily.  She uses Flonase intermittently.  She does not feel congested.  Occupation: retired, Marital status: widowed, Substance use: beer as above Diet: fair, Exercise: no structured but is physically active in her yard Last eye exam: Up-to-date.  Plans for possible right revision surgery for cataracts scarring Last colonoscopy: Up-to-date Last mammogram: Needs Last pap smear: N/A. Refills needed today: flexeril, zoloft Immunizations needed: Influenza vaccination  Past Medical History:  Diagnosis Date  . Cataract   . Chronic kidney disease 1977   hx ofbladder tumor and cyst on kidney  . GERD (gastroesophageal reflux disease)   . H. pylori infection   . Hiatal hernia   . Hyperlipidemia   . Hypertension   . Vitamin D deficiency    Social History   Socioeconomic History  . Marital status: Married    Spouse name: Not on file  . Number of children: Not on file  . Years of education: Not on file  . Highest  education level: Not on file  Occupational History  . Not on file  Tobacco Use  . Smoking status: Never Smoker  . Smokeless tobacco: Never Used  Vaping Use  . Vaping Use: Never used  Substance and Sexual Activity  . Alcohol use: Yes    Comment: rare use  . Drug use: No  . Sexual activity: Not Currently  Other Topics Concern  . Not on file  Social History Narrative  . Not on file   Social Determinants of Health   Financial Resource Strain:   . Difficulty of Paying Living Expenses: Not on file  Food Insecurity:   . Worried About Charity fundraiser in the Last Year: Not on file  . Ran Out of Food in the Last Year: Not on file  Transportation Needs:   . Lack of Transportation (Medical): Not on file  . Lack of Transportation (Non-Medical): Not on file  Physical Activity:   . Days of Exercise per Week: Not on file  . Minutes of Exercise per Session: Not on file  Stress:   . Feeling of Stress : Not on file  Social Connections:   . Frequency of Communication with Friends and Family: Not on file  . Frequency of Social Gatherings with Friends and Family: Not on file  . Attends Religious Services: Not on file  . Active Member of Clubs or Organizations: Not on file  . Attends Archivist  Meetings: Not on file  . Marital Status: Not on file  Intimate Partner Violence:   . Fear of Current or Ex-Partner: Not on file  . Emotionally Abused: Not on file  . Physically Abused: Not on file  . Sexually Abused: Not on file   Past Surgical History:  Procedure Laterality Date  . Bladder tumor removed    . JOINT REPLACEMENT Left 9/13   knee  . skin ca removal     Family History  Problem Relation Age of Onset  . Cancer Father        stomach cancer  . Hypertension Mother   . Hyperlipidemia Mother   . Stroke Maternal Grandmother   . Hypertension Maternal Grandmother     Current Outpatient Medications:  .  Cholecalciferol (VITAMIN D) 2000 UNITS CAPS, Take by mouth. 1 daily   , Disp: , Rfl:  .  cyclobenzaprine (FLEXERIL) 5 MG tablet, Take 1 tablet by mouth three times daily as needed for muscle spasm, Disp: 30 tablet, Rfl: 0 .  fluticasone (FLONASE) 50 MCG/ACT nasal spray, Place 2 sprays into both nostrils daily., Disp: 16 g, Rfl: 6 .  losartan (COZAAR) 100 MG tablet, Take 1 tablet (100 mg total) by mouth daily., Disp: 90 tablet, Rfl: 1 .  naproxen (NAPROSYN) 500 MG tablet, Take 1 tablet (500 mg total) by mouth 2 (two) times daily with a meal. (prn pain/ swelling), Disp: 60 tablet, Rfl: 0  Allergies  Allergen Reactions  . Ace Inhibitors Cough  . Celexa [Citalopram Hydrobromide]     Shakes- chest and arms  . Penicillins Swelling     ROS: Review of Systems Pertinent items noted in HPI and remainder of comprehensive ROS otherwise negative.    Physical exam BP 133/77   Pulse 71   Temp (!) 97.3 F (36.3 C)   Ht 5' 9" (1.753 m)   Wt 195 lb 9.6 oz (88.7 kg)   SpO2 99%   BMI 28.89 kg/m  General appearance: alert, cooperative, appears stated age and no distress Head: Normocephalic, without obvious abnormality, atraumatic Eyes: negative findings: lids and lashes normal, conjunctivae and sclerae normal, corneas clear and pupils equal, round, reactive to light and accomodation Ears: normal TM's and external ear canals both ears Nose: Nares normal. Septum midline. Mucosa normal. No drainage or sinus tenderness. Throat: lips, mucosa, and tongue normal; teeth and gums normal Neck: no adenopathy, no carotid bruit, supple, symmetrical, trachea midline and thyroid not enlarged, symmetric, no tenderness/mass/nodules Back: symmetric, no curvature. ROM normal. No CVA tenderness. Lungs: clear to auscultation bilaterally Heart: regular rate and rhythm, S1, S2 normal, no murmur, click, rub or gallop Abdomen: soft, non-tender; bowel sounds normal; no masses,  no organomegaly Extremities: extremities normal, atraumatic, no cyanosis or edema Pulses: 2+ and symmetric Skin:  Skin color, texture, turgor normal. No rashes or lesions Lymph nodes: Cervical, supraclavicular, and axillary nodes normal. Neurologic: Grossly normal Psych: Mood depressed, patient intermittently tearful, poor eye contact, pleasant and interactive.  Does not appear to be responding to internal stimuli  Depression screen [facility] 2/9 08/06/2020 06/20/2020 06/10/2020  Decreased Interest 1 0 1  Down, Depressed, Hopeless 2 0 1  PHQ - 2 Score 3 0 2  Altered sleeping 1 - 2  Tired, decreased energy 2 - 1  Change in appetite 0 - 1  Feeling bad or failure about yourself  2 - 1  Trouble concentrating 1 - 2  Moving slowly or fidgety/restless 1 - 0  Suicidal thoughts 0 - 0  PHQ-9 Score 10 - 9  Difficult doing work/chores Somewhat difficult - Somewhat difficult  Some recent data might be hidden   GAD 7 : Generalized Anxiety Score 08/06/2020 06/10/2020 06/16/2019 11/16/2018  Nervous, Anxious, on Edge _0 Control/stop worrying 2 1 0 2  Worry too much - different things 2 1 0 2  Trouble relaxing _1 Restless 0 1 0 0  Easily annoyed or irritable _2 Afraid - awful might happen 2 0 1 0  Total GAD 7 Score _3 Anxiety Difficulty Not difficult at all Not difficult at all Somewhat difficult Somewhat difficult   Assessment/ Plan: Samantha Olson here for annual physical exam.   1. Annual physical exam Patient to schedule mammogram  2. Need for immunization against influenza Administered during today's visit - Flu Vaccine QUAD High Dose(Fluad)  3. Mixed hyperlipidemia She will come in for fasting lab - Lipid panel; Future - CMP14+EGFR; Future - TSH; Future  4. Pre-diabetes Plan for recheck of A1c - Bayer DCA Hb A1c Waived; Future  5. Screening, anemia, deficiency, iron - CBC; Future  6. Lumbar paraspinal muscle spasm Flexeril renewed - cyclobenzaprine (FLEXERIL) 5 MG tablet; Take 1 tablet (5 mg total) by mouth 3 (three) times daily as needed. for muscle spams  Dispense: 30  tablet; Refill: 2  7. Grief reaction Continues to struggle with grief after the passing of her spouse.  I did offer referral for counseling services but she wants to hold off on this for now.  I encouraged her to contact me at any time should she change her mind.  In the interim, we will restart her Zoloft 50 mg daily.  I would like to see her back in about 3 months to reconvene and make sure that she is doing okay  8. Depression, recurrent ([place]) Not controlled. - sertraline (ZOLOFT) 50 MG tablet; Take 1 tablet (50 mg total) by mouth daily.  Dispense: 90 tablet; Refill: 3  9. Stress at home Counseling recommended as above  10. Seasonal allergic rhinitis due to pollen Okay to use Claritin in the morning and half a tablet of Zyrtec at night if needed for uncontrolled allergies at the turn of season  Handout on healthy lifestyle choices, including diet (rich in fruits, vegetables and lean meats and low in salt and simple carbohydrates) and exercise (at least 30 minutes of moderate physical activity daily).  Samantha M. Lajuana Ripple, DO

## 2020-08-12 ENCOUNTER — Other Ambulatory Visit: Payer: Medicare Other

## 2020-08-12 ENCOUNTER — Other Ambulatory Visit: Payer: Self-pay

## 2020-08-12 DIAGNOSIS — E782 Mixed hyperlipidemia: Secondary | ICD-10-CM

## 2020-08-12 DIAGNOSIS — R7303 Prediabetes: Secondary | ICD-10-CM

## 2020-08-12 DIAGNOSIS — Z130 Encounter for screening for diseases of the blood and blood-forming organs and certain disorders involving the immune mechanism: Secondary | ICD-10-CM

## 2020-08-12 LAB — BAYER DCA HB A1C WAIVED: HB A1C (BAYER DCA - WAIVED): 6.1 % (ref <7.0)

## 2020-08-13 LAB — LIPID PANEL
Chol/HDL Ratio: 2.7 ratio (ref 0.0–4.4)
Cholesterol, Total: 208 mg/dL — ABNORMAL HIGH (ref 100–199)
HDL: 78 mg/dL (ref >39)
LDL Chol Calc (NIH): 113 mg/dL — ABNORMAL HIGH (ref 0–99)
Triglycerides: 97 mg/dL (ref 0–149)
VLDL Cholesterol Cal: 17 mg/dL (ref 5–40)

## 2020-08-13 LAB — CMP14+EGFR
ALT: 21 IU/L (ref 0–32)
AST: 18 IU/L (ref 0–40)
Albumin/Globulin Ratio: 2 (ref 1.2–2.2)
Albumin: 4.3 g/dL (ref 3.8–4.8)
Alkaline Phosphatase: 88 IU/L (ref 44–121)
BUN/Creatinine Ratio: 24 (ref 12–28)
BUN: 20 mg/dL (ref 8–27)
Bilirubin Total: 0.4 mg/dL (ref 0.0–1.2)
CO2: 24 mmol/L (ref 20–29)
Calcium: 9.9 mg/dL (ref 8.7–10.3)
Chloride: 105 mmol/L (ref 96–106)
Creatinine, Ser: 0.85 mg/dL (ref 0.57–1.00)
GFR calc Af Amer: 81 mL/min/{1.73_m2} (ref >59)
GFR calc non Af Amer: 71 mL/min/{1.73_m2} (ref >59)
Globulin, Total: 2.1 g/dL (ref 1.5–4.5)
Glucose: 101 mg/dL — ABNORMAL HIGH (ref 65–99)
Potassium: 5.2 mmol/L (ref 3.5–5.2)
Sodium: 141 mmol/L (ref 134–144)
Total Protein: 6.4 g/dL (ref 6.0–8.5)

## 2020-08-13 LAB — CBC
Hematocrit: 43.5 % (ref 34.0–46.6)
Hemoglobin: 14.3 g/dL (ref 11.1–15.9)
MCH: 29.5 pg (ref 26.6–33.0)
MCHC: 32.9 g/dL (ref 31.5–35.7)
MCV: 90 fL (ref 79–97)
Platelets: 247 10*3/uL (ref 150–450)
RBC: 4.84 x10E6/uL (ref 3.77–5.28)
RDW: 12.7 % (ref 11.7–15.4)
WBC: 6 10*3/uL (ref 3.4–10.8)

## 2020-08-13 LAB — TSH: TSH: 1.59 u[IU]/mL (ref 0.450–4.500)

## 2020-08-14 ENCOUNTER — Telehealth: Payer: Self-pay

## 2020-08-14 ENCOUNTER — Other Ambulatory Visit: Payer: Self-pay | Admitting: Family Medicine

## 2020-08-14 MED ORDER — ATORVASTATIN CALCIUM 20 MG PO TABS: 20.0000 mg | ORAL_TABLET | Freq: Every day | ORAL | 3 refills | Status: DC

## 2020-08-14 NOTE — Telephone Encounter (Signed)
Pt called to get lab results. Reviewed results with pt per Dr Alver Sorrow note. Pt voiced understanding and said that she would be ok with taking cholesterol medicine.

## 2020-12-10 ENCOUNTER — Ambulatory Visit (INDEPENDENT_AMBULATORY_CARE_PROVIDER_SITE_OTHER): Payer: Medicare Other | Admitting: *Deleted

## 2020-12-10 ENCOUNTER — Other Ambulatory Visit: Payer: Self-pay

## 2020-12-10 DIAGNOSIS — Z23 Encounter for immunization: Secondary | ICD-10-CM

## 2021-01-08 ENCOUNTER — Other Ambulatory Visit: Payer: Self-pay | Admitting: *Deleted

## 2021-01-08 DIAGNOSIS — I10 Essential (primary) hypertension: Secondary | ICD-10-CM

## 2021-01-08 MED ORDER — LOSARTAN POTASSIUM 100 MG PO TABS: 100.0000 mg | ORAL_TABLET | Freq: Every day | ORAL | 0 refills | Status: DC

## 2021-01-27 ENCOUNTER — Other Ambulatory Visit: Payer: Self-pay

## 2021-01-27 ENCOUNTER — Ambulatory Visit (INDEPENDENT_AMBULATORY_CARE_PROVIDER_SITE_OTHER): Payer: Medicare Other

## 2021-01-27 ENCOUNTER — Ambulatory Visit (INDEPENDENT_AMBULATORY_CARE_PROVIDER_SITE_OTHER): Payer: Medicare Other | Admitting: Family Medicine

## 2021-01-27 ENCOUNTER — Encounter: Payer: Self-pay | Admitting: Family Medicine

## 2021-01-27 VITALS — BP 162/88 | HR 65 | Temp 97.9°F | Ht 69.0 in | Wt 193.4 lb

## 2021-01-27 DIAGNOSIS — I10 Essential (primary) hypertension: Secondary | ICD-10-CM | POA: Diagnosis not present

## 2021-01-27 DIAGNOSIS — M7701 Medial epicondylitis, right elbow: Secondary | ICD-10-CM | POA: Diagnosis not present

## 2021-01-27 DIAGNOSIS — M2041 Other hammer toe(s) (acquired), right foot: Secondary | ICD-10-CM

## 2021-01-27 DIAGNOSIS — E782 Mixed hyperlipidemia: Secondary | ICD-10-CM

## 2021-01-27 DIAGNOSIS — M79672 Pain in left foot: Secondary | ICD-10-CM

## 2021-01-27 MED ORDER — LOSARTAN POTASSIUM 100 MG PO TABS: 100.0000 mg | ORAL_TABLET | Freq: Every day | ORAL | 3 refills | Status: DC

## 2021-01-27 NOTE — Patient Instructions (Signed)
Ok to continue Naproxen for now.  Ice affect area.  Will contact you with results of xray once available.  In meantime, referral to podiatry placed to assist with foot pain and hammer toe  Ice elbow. Sports band for golfer's elbow may be helpful as well  https://[email or web address].aaos.org/en/diseases--conditions/hammer-[email or web address]  Hammer Toe Hammer toe is a change in the shape, or a deformity, of the toe. The deformity causes the middle joint of the toe to stay bent. Hammer toe starts gradually. At first, the toe can be straightened. Then over time, the toe deformity becomes stiff, inflexible, and permanently bent. Hammer toe usually affects the second, third, or fourth toe. A hammer toe causes pain, especially when wearing shoes. Corns and calluses can result from the toe rubbing against the inside of the shoe. Early treatments to keep the toe straight may relieve pain. As the deformity of the toe becomes stiff and permanent, surgery may be needed to straighten the toe. What are the causes? This condition is caused by abnormal bending of the toe joint that is closest to your foot. Over time, the toe bending downward pulls on the muscles and connections (tendons) of the toe joint, making them weak and stiff. Wearing shoes that are too narrow in the toe box and do not allow toes to fully straighten can cause this condition. What increases the risk? You are more likely to develop this condition if you:  Are an older female.  Wear shoes that are too small, or wear high-heeled shoes that pinch your toes.  Have a second toe that is longer than your big toe (first toe).  Injure your foot or toe.  Have arthritis, or have a nerve or muscle disorder.  Have diabetes or a condition known as Charcot joint, which may cause you to walk abnormally.  Have a family history of hammer toe.  Are a Engineer, mining. What are the signs or symptoms? Pain and deformity of the toe are the main symptoms of this condition. The  pain is worse when wearing shoes, walking, or running. Other symptoms may include:  A thickened patch of skin, called a corn or callus, that forms over the top of the bent part of the toe or between the toes.  Redness and a burning feeling on the bent toe.  An open sore that forms on the top of the bent toe.  Not being able to straighten the affected toe.   How is this diagnosed? This condition is diagnosed based on your symptoms and a physical exam. During the exam, your health care provider will try to straighten your toe to see how stiff the deformity is. You may also have tests, such as:  A blood test to check for rheumatoid arthritis or diabetes.  An X-ray to show how severe the toe deformity is. How is this treated? Treatment for this condition depends on whether the toe is flexible or deformed and no longer moveable. In less severe cases, a hammer toe can be straightened without surgery. These treatments include:  Taping the toe into a straightened position.  Using pads and cushions to protect the bent toe.  Wearing shoes that provide enough room for the toes.  Doing toe-stretching exercises at home.  Taking an NSAID, such as ibuprofen, to reduce pain and swelling.  Using special orthotics or insoles for pain relief and to improve walking. If these treatments do not help or the toe has a severe deformity and cannot be straightened, surgery is the next option.  The most common surgeries used to straighten a hammer toe include:  Arthroplasty or osteotomy. Part of the toe joint is reconstructed or removed, which allows the toe to straighten.  Fusion. Cartilage between the two bones of the joint is taken out, and the bones are fused together into one longer bone.  Implantation. Part of the bone is removed and replaced with an implant to allow the toe to move again.  Flexor tendon transfer. The tendons that curl the toes down (flexor tendons) are repositioned. Follow these  instructions at home:  Take over-the-counter and prescription medicines only as told by your health care provider.  Do toe-straightening and stretching exercises as told by your health care provider.  Keep all follow-up visits. This is important. How is this prevented?  Wear shoes that fit properly and give your toes enough room. Shoes should not cause pain.  Buy shoes at the end of the day to make sure they fit well, since your foot may swell during the day. Make sure they are comfortable before you buy them.  As you age, your shoe size might change, including the width. Measure both feet and buy shoes for the larger foot.  A shoe repair store might be able to stretch shoes that feel tight in spots.  Do not wear high-heeled shoes or shoes with pointed toes. Contact a health care provider if:  Your pain gets worse.  Your toe becomes red or swollen.  You develop an open sore on your toe. Summary  Hammer toe is a condition that gradually causes your toe to become bent and stiff.  Hammer toe can be treated by taping the toe into a straightened position and doing toe-stretching exercises. If these treatments do not help, surgery may be needed.  To prevent this condition, wear shoes that fit properly, give your toes enough room, and do not cause pain. This information is not intended to replace advice given to you by your health care provider. Make sure you discuss any questions you have with your health care provider. Document Revised: 01/04/2020 Document Reviewed: 01/04/2020 Elsevier Patient Education  2021 [employer].  Golfer's Elbow  Golfer's elbow (medial epicondylitis) is a condition that results from inflammation of the strong bands of tissue (tendons) that attach your forearm muscles to the inside of your bone at the elbow. These tendons affect the muscles that bend the palm toward the wrist (flexion). The tendons become less flexible with age. This condition is called  golfer's elbow because it is more common among people who constantly bend and twist their wrists, such as golfers. This injury is usually caused by repeated use of the same muscles. What are the causes? This condition is caused by:  Repeatedly flexing, turning, or twisting your wrist.  Frequently gripping objects with your hands.  Sudden injury. What increases the risk? This condition is more likely to develop in people who play golf, baseball, or tennis. This injury is more common among people who have jobs that require the constant use of their hands, such as:  People who use computers.  Carpenters.  Butchers.  Musicians. What are the signs or symptoms? This condition causes elbow pain that may spread to your forearm and upper arm. Symptoms of this condition include:  Pain at the inner elbow, forearm, or wrist.  A weak grip in the hand. The pain may get worse when you bend your wrist downward. How is this diagnosed? This condition is diagnosed based on your symptoms, your medical  history, and a physical exam. During the exam, your health care provider may:  Test your grip strength.  Move your wrist to check for pain. You may also have an MRI to:  Confirm the diagnosis.  Look for other issues.  Check for tears in the ligaments, muscles, or tendons. How is this treated? Treatment for this condition includes:  Stopping all activities that make you bend or twist your elbow or wrist and waiting until your pain and other symptoms go away before resuming those activities.  Wearing an elbow brace or wrist splint to restrict the movements that cause symptoms.  Icing your inner elbow, forearm, or wrist to relieve pain.  Taking NSAIDs, such as ibuprofen, or getting corticosteroid injections to reduce pain and swelling.  Doing stretching, range-of-motion, and strengthening exercises (physical therapy) as told by your health care provider. In rare cases, surgery may be needed  if your condition does not improve. Follow these instructions at home: If you have a brace or splint:  Wear the brace or splint as told by your health care provider. Remove it only as told by your health care provider.  Check the skin around the brace or splint every day. Tell your health care provider about any concerns.  Loosen the brace or splint if your fingers tingle, become numb, or turn cold and blue.  Keep it clean.  If the brace or splint is not waterproof: ? Do not let it get wet. ? Cover it with a watertight covering when you take a bath or shower. Managing pain, stiffness, and swelling  If directed, put ice on the injured area. To do this: ? If you have a removable brace or splint, remove it as told by your health care provider. ? Put ice in a plastic bag. ? Place a towel between your skin and the bag. ? Leave the ice on for 20 minutes, 2-3 times a day. ? Remove the ice if your skin turns bright red. This is very important. If you cannot feel pain, heat, or cold, you have a greater risk of damage to the area.  Move your fingers often to avoid stiffness and swelling.   Activity  Rest your injured area as told by your health care provider.  Return to your normal activities as told by your health care provider. Ask your health care provider what activities are safe for you.  Do exercises as told by your health care provider. Lifestyle  If your condition is caused by sports, work with a trainer to make sure that you: ? Use the correct technique. ? Use the proper equipment.  If your condition is work related, talk with your employer about ways to manage your condition at work. General instructions  Take over-the-counter and prescription medicines only as told by your health care provider.  Do not use any products that contain nicotine or tobacco. These products include cigarettes, chewing tobacco, and vaping devices, such as e-cigarettes. If you need help quitting,  ask your health care provider.  Keep all follow-up visits. This is important. How is this prevented?  Before and after activity: ? Warm up and stretch before being active. ? Cool down and stretch after being active. ? Give your body time to rest between periods of activity.  During activity: ? Make sure to use equipment that fits you. ? If you play golf, slow your golf swing to reduce shock in the arm when making contact with the ball.  Maintain physical fitness, including: ?  Strength. ? Flexibility. ? Endurance.  Do exercises to strengthen the forearm muscles. Contact a health care provider if:  Your pain does not improve or it gets worse.  You notice numbness in your hand. Get help right away if:  Your pain is severe.  You cannot move your wrist. Summary  Golfer's elbow, also called medial epicondylitis, is a condition that results from inflammation of the strong bands of tissue (tendons) that attach your forearm muscles to the inside of your bone at the elbow.  This injury usually results from overuse.  Symptoms of this condition include decreased grip strength and pain at the inner elbow, forearm, or wrist.  This injury is treated with rest, a brace or splint, ice, medicines, physical therapy, and surgery as needed. This information is not intended to replace advice given to you by your health care provider. Make sure you discuss any questions you have with your health care provider. Document Revised: 04/09/2020 Document Reviewed: 04/09/2020 Elsevier Patient Education  [place].

## 2021-01-27 NOTE — Progress Notes (Signed)
Subjective: CC: Foot pain PCP: [name], DO Samantha Olson is a 70 y.o. female presenting to clinic today for:  1.  Foot pain Patient reports that she hurt her foot, left, last Wednesday while walking.  She had a pop sensation on the top of the left foot with subsequent pain.  Denies any swelling, ecchymosis or change in color.  She is been using Naprosyn twice daily for pain with some relief.  She also notes a abnormality in her middle right toe and would like to ask me about that  2.  Medial elbow pain She reports medial elbow pain that seems to be worse after gardening.  This is occurring on the right side.  Using Naprosyn as above but no other treatments.  She thought that she might have arthritis in her elbow   ROS: Per HPI  Allergies  Allergen Reactions  . Ace Inhibitors Cough  . Celexa [Citalopram Hydrobromide]     Shakes- chest and arms  . Penicillins Swelling   Past Medical History:  Diagnosis Date  . Cataract   . Chronic kidney disease 1977   hx ofbladder tumor and cyst on kidney  . GERD (gastroesophageal reflux disease)   . H. pylori infection   . Hiatal hernia   . Hyperlipidemia   . Hypertension   . Vitamin D deficiency     Current Outpatient Medications:  .  atorvastatin (LIPITOR) 20 MG tablet, Take 1 tablet (20 mg total) by mouth daily., Disp: 90 tablet, Rfl: 3 .  Cholecalciferol (VITAMIN D) 2000 UNITS CAPS, Take by mouth. 1 daily  , Disp: , Rfl:  .  cyclobenzaprine (FLEXERIL) 5 MG tablet, Take 1 tablet (5 mg total) by mouth 3 (three) times daily as needed. for muscle spams, Disp: 30 tablet, Rfl: 2 .  fluticasone (FLONASE) 50 MCG/ACT nasal spray, Place 2 sprays into both nostrils daily., Disp: 16 g, Rfl: 6 .  losartan (COZAAR) 100 MG tablet, Take 1 tablet (100 mg total) by mouth daily. (NEEDS TO BE SEEN BEFORE NEXT REFILL), Disp: 30 tablet, Rfl: 0 .  naproxen (NAPROSYN) 500 MG tablet, Take 1 tablet (500 mg total) by mouth 2 (two) times daily with  a meal. (prn pain/ swelling), Disp: 60 tablet, Rfl: 0 .  sertraline (ZOLOFT) 50 MG tablet, Take 1 tablet (50 mg total) by mouth daily., Disp: 90 tablet, Rfl: 3 Social History   Socioeconomic History  . Marital status: Married    Spouse name: Not on file  . Number of children: Not on file  . Years of education: Not on file  . Highest education level: Not on file  Occupational History  . Not on file  Tobacco Use  . Smoking status: Never Smoker  . Smokeless tobacco: Never Used  Vaping Use  . Vaping Use: Never used  Substance and Sexual Activity  . Alcohol use: Yes    Comment: rare use  . Drug use: No  . Sexual activity: Not Currently  Other Topics Concern  . Not on file  Social History Narrative  . Not on file   Social Determinants of Health   Financial Resource Strain: Not on file  Food Insecurity: Not on file  Transportation Needs: Not on file  Physical Activity: Not on file  Stress: Not on file  Social Connections: Not on file  Intimate Partner Violence: Not on file   Family History  Problem Relation Age of Onset  . Cancer Father  stomach cancer  . Hypertension Mother   . Hyperlipidemia Mother   . Stroke Maternal Grandmother   . Hypertension Maternal Grandmother     Objective: Office vital signs reviewed. BP (!) 162/88   Pulse 65   Temp 97.9 F (36.6 C)   Ht $R'5\' 9"'Kb$  (1.753 m)   Wt 193 lb 6.4 oz (87.7 kg)   SpO2 100%   BMI 28.56 kg/m   Physical Examination:  General: Awake, alert, well nourished, No acute distress Cardio: Regular rate and rhythm.  S1-S2 heard.  No murmurs Extremities: warm, well perfused, No edema, cyanosis or clubbing; +2 pulses bilaterally MSK: Somewhat antalgic gait with normal station.  Right foot: Second digit with appreciable hammertoe.  There is mild erythema noted along the DIP  Left foot: No gross swelling, ecchymosis or deformity.  She has tenderness palpation along the shaft/base of the second metatarsal.  No palpable  bony abnormality.  Assessment/ Plan: 70 y.o. female   Left foot pain - Plan: DG Foot Complete Left, Ambulatory referral to Podiatry  Medial epicondylitis of right elbow  Hammer toe of right foot - Plan: Ambulatory referral to Podiatry  Essential hypertension - Plan: losartan (COZAAR) 100 MG tablet  Mixed hyperlipidemia - Plan: CMP14+EGFR, Lipid panel  Uncertain etiology.  She has been usual tenderness to palpation along the shaft/base of the second metatarsal.  I have ordered plain films to further evaluate. ?  Stress fracture versus tendinopathy but somewhat unusual given reports of "popping sound".  Recommended ice and continued NSAID for medial epicondylitis.  Consider sports band  Referral placed to podiatry to assist with hammertoe.  Discussed using corn pad so as to reduce risk of friction and possible skin breakdown  Blood pressure is not controlled but she is in pain and has been using an oral NSAID.  Recommend recheck of blood pressure in the next 2 to 3 weeks with nurse.  Continue losartan.  She will come in for fasting labs at her earliest convenience.  Statin recently started  No orders of the defined types were placed in this encounter.  No orders of the defined types were placed in this encounter.    [name], DO [place] [phone]

## 2021-01-30 ENCOUNTER — Other Ambulatory Visit: Payer: Medicare Other

## 2021-01-30 ENCOUNTER — Other Ambulatory Visit: Payer: Self-pay

## 2021-01-30 DIAGNOSIS — E782 Mixed hyperlipidemia: Secondary | ICD-10-CM

## 2021-01-31 ENCOUNTER — Other Ambulatory Visit: Payer: Self-pay | Admitting: *Deleted

## 2021-01-31 ENCOUNTER — Ambulatory Visit: Payer: Medicare Other | Admitting: Podiatry

## 2021-01-31 ENCOUNTER — Encounter: Payer: Self-pay | Admitting: Podiatry

## 2021-01-31 DIAGNOSIS — M25572 Pain in left ankle and joints of left foot: Secondary | ICD-10-CM | POA: Diagnosis not present

## 2021-01-31 DIAGNOSIS — M2041 Other hammer toe(s) (acquired), right foot: Secondary | ICD-10-CM

## 2021-01-31 DIAGNOSIS — M84375A Stress fracture, left foot, initial encounter for fracture: Secondary | ICD-10-CM | POA: Diagnosis not present

## 2021-01-31 LAB — LIPID PANEL
Chol/HDL Ratio: 2 ratio (ref 0.0–4.4)
Cholesterol, Total: 156 mg/dL (ref 100–199)
HDL: 80 mg/dL (ref >39)
LDL Chol Calc (NIH): 66 mg/dL (ref 0–99)
Triglycerides: 46 mg/dL (ref 0–149)
VLDL Cholesterol Cal: 10 mg/dL (ref 5–40)

## 2021-01-31 LAB — CMP14+EGFR
ALT: 19 IU/L (ref 0–32)
AST: 17 IU/L (ref 0–40)
Albumin/Globulin Ratio: 2.4 — ABNORMAL HIGH (ref 1.2–2.2)
Albumin: 4.3 g/dL (ref 3.8–4.8)
Alkaline Phosphatase: 68 IU/L (ref 44–121)
BUN/Creatinine Ratio: 36 — ABNORMAL HIGH (ref 12–28)
BUN: 28 mg/dL — ABNORMAL HIGH (ref 8–27)
Bilirubin Total: 0.5 mg/dL (ref 0.0–1.2)
CO2: 23 mmol/L (ref 20–29)
Calcium: 9.7 mg/dL (ref 8.7–10.3)
Chloride: 106 mmol/L (ref 96–106)
Creatinine, Ser: 0.77 mg/dL (ref 0.57–1.00)
Globulin, Total: 1.8 g/dL (ref 1.5–4.5)
Glucose: 104 mg/dL — ABNORMAL HIGH (ref 65–99)
Potassium: 4.9 mmol/L (ref 3.5–5.2)
Sodium: 143 mmol/L (ref 134–144)
Total Protein: 6.1 g/dL (ref 6.0–8.5)
eGFR: 84 mL/min/{1.73_m2} (ref >59)

## 2021-01-31 NOTE — Patient Instructions (Addendum)
Stress Fracture  A stress fracture is a small break or crack in a bone. A stress fracture can be fully broken (complete) or partially broken (incomplete). The most common sites for stress fractures are the bones in the front of your feet (metatarsals), your heel (calcaneus), and the long bone of your lower leg (tibia). What are the causes? This condition is caused by overuse or repetitive exercise, such as running. It happens when a bone cannot absorb any more shock because the muscles around it are weak. Stress fractures happen most commonly when:  You rapidly increase or start a new physical activity.  You use shoes that are worn out or do not fit properly.  You exercise on a new surface. What increases the risk? You are more likely to develop this condition if:  You have a condition that causes weak bones (osteoporosis).  You are female. Stress fractures are more likely to occur in women. What are the signs or symptoms? The most common symptom of a stress fracture is feeling pain when you are using or putting weight on the affected part of your body. The pain usually improves when you are resting. Other symptoms may include:  Swelling of the affected area.  Pain in the area when it is touched. Stress fracture pain usually develops over time. How is this diagnosed? This condition may be diagnosed by:  Your symptoms.  Your medical history.  A physical exam.  Imaging tests, such as: ? X-rays. ? MRI. ? Bone scan. How is this treated? Treatment depends on the severity of your stress fracture. It is commonly treated with resting, icing, compression, and elevation (RICE therapy). Treatment may also include:  Medicines to reduce inflammation.  A cast or a walking shoe.  Crutches.  Surgery. This is usually only in severe cases. Follow these instructions at home: If you have a cast:  Do not put pressure on any part of the cast until it is fully hardened. This may take  several hours.  Do not stick anything inside the cast to scratch your skin. Doing that increases your risk of infection.  Check the skin around the cast every day. Tell your health care provider about any concerns.  You may put lotion on dry skin around the edges of the cast. Do not put lotion on the skin underneath the cast.  Keep the cast clean.  If the cast is not waterproof: ? Do not let it get wet. ? Cover it with a watertight covering when you take a bath or a shower. If you have a walking shoe:  Wear the shoe as told by your health care provider. Remove it only as told by your health care provider.  Loosen the shoe if your toes tingle, become numb, or turn cold and blue.  Keep the shoe clean.  If the shoe is not waterproof: ? Do not let it get wet. Managing pain, stiffness, and swelling  If directed, apply ice to the injured area: ? If you have a walking shoe, remove the shoe as told by your health care provider. ? Put ice in a plastic bag. ? Place a towel between your skin and the bag or between your cast and the bag. ? Leave the ice on for 20 minutes, 2-3 times per day.  Move your toes often to avoid stiffness and to lessen swelling.  Raise (elevate) the injured area above the level of your heart while you are sitting or lying down.   Activity  Rest as directed by your health care provider. Ask your health care provider if you may do alternative exercises, such as swimming or biking, while you are healing.  Return to your normal activities as directed by your health care provider. Ask your health care provider what activities are safe for you.  Perform range-of-motion exercises only as directed by your health care provider. General instructions  Do not use the injured limb to support yourbody weight until your health care provider says that you can. Use crutches if your health care provider tells you to do so.  Do not use any products that contain nicotine or  tobacco, such as cigarettes and e-cigarettes. These can delay bone healing. If you need help quitting, ask your health care provider.  Take over-the-counter and prescription medicines only as told by your health care provider.  Keep all follow-up visits as told by your health care provider. This is important. How is this prevented?  Only wear shoes that: ? Fit well. ? Are not worn out.  Eat a healthy diet that contains vitamin D and calcium. This helps keep your bones strong. Good sources of calcium and vitamin D include: ? Low-fat dairy products such as milk, yogurt, and cheese. ? Certain fish, such as fresh or canned salmon, tuna, and sardines. ? Products that have calcium and vitamin D added to them (fortified products), such as fortified cereals or juice.  Be careful when you start a new physical activity. Give your body time to adjust.  Avoid doing only one kind of activity. Do different exercises, such as swimming and running, so that no single part of your body gets overused.  Do strength-training exercises. Contact a health care provider if:  Your pain gets worse.  You have new symptoms.  You have increased swelling. Get help right away if:  You lose feeling in the injured area. Summary  A stress fracture is a small break or crack in a bone. A stress fracture can be fully broken (complete) or partially broken (incomplete).  This condition is caused by overuse or repetitive exercise, such as running.  The most common symptom of a stress fracture is feeling pain when you are using or putting weight on the affected part of your body.  Treatment depends on the severity of your stress fracture. This information is not intended to replace advice given to you by your health care provider. Make sure you discuss any questions you have with your health care provider. Document Revised: 11/09/2017 Document Reviewed: 11/09/2017 Elsevier Patient Education  2021 [employer].

## 2021-02-04 ENCOUNTER — Other Ambulatory Visit: Payer: Self-pay | Admitting: Family Medicine

## 2021-02-04 DIAGNOSIS — I10 Essential (primary) hypertension: Secondary | ICD-10-CM

## 2021-02-05 NOTE — Progress Notes (Signed)
Subjective:   Patient ID: Samantha Olson, female   DOB: 70 y.o.   MRN: [id number]   HPI  70 year old female presents the office today for concerns of left foot pain.  She said this started greater than 1 week ago.  She states she was walking and she noticed pain in her upper foot as well estimating pain in the toes.  Minimal swelling.  She does get some burning at times and sharp pain.  Also she has a hammertoe on the right second toe does not hurt.  Does wrap the top of her shoes.  She has no other concerns today.  Review of Systems  All other systems reviewed and are negative.  Past Medical History:  Diagnosis Date  . Cataract   . Chronic kidney disease 1977   hx ofbladder tumor and cyst on kidney  . GERD (gastroesophageal reflux disease)   . H. pylori infection   . Hiatal hernia   . Hyperlipidemia   . Hypertension   . Vitamin D deficiency     Past Surgical History:  Procedure Laterality Date  . Bladder tumor removed    . JOINT REPLACEMENT Left 9/13   knee  . skin ca removal       Current Outpatient Medications:  .  atorvastatin (LIPITOR) 20 MG tablet, Take 1 tablet (20 mg total) by mouth daily., Disp: 90 tablet, Rfl: 3 .  Cholecalciferol (VITAMIN D) 2000 UNITS CAPS, Take by mouth. 1 daily, Disp: , Rfl:  .  cyclobenzaprine (FLEXERIL) 5 MG tablet, Take 1 tablet (5 mg total) by mouth 3 (three) times daily as needed. for muscle spams (Patient not taking: Reported on 01/27/2021), Disp: 30 tablet, Rfl: 2 .  fluorouracil (EFUDEX) 5 % cream, fluorouracil 5 % topical cream, Disp: , Rfl:  .  fluticasone (FLONASE) 50 MCG/ACT nasal spray, Place 2 sprays into both nostrils daily. (Patient not taking: Reported on 01/27/2021), Disp: 16 g, Rfl: 6 .  losartan (COZAAR) 100 MG tablet, Take 1 tablet (100 mg total) by mouth daily., Disp: 90 tablet, Rfl: 1 .  naproxen (NAPROSYN) 500 MG tablet, Take 1 tablet (500 mg total) by mouth 2 (two) times daily with a meal. (prn pain/ swelling), Disp: 60  tablet, Rfl: 0 .  sertraline (ZOLOFT) 50 MG tablet, Take 1 tablet (50 mg total) by mouth daily., Disp: 90 tablet, Rfl: 3  Allergies  Allergen Reactions  . Ace Inhibitors Cough  . Celexa [Citalopram Hydrobromide]     Shakes- chest and arms  . Penicillins Swelling         Objective:  Physical Exam  General: AAO x3, NAD  Dermatological: Mild irritation of the dorsal PIPJ of the right second toe from the underlying hammertoe.  No skin breakdown, warmth or any drainage or signs of infection.  No open lesions bilaterally.  Vascular: Dorsalis Pedis artery and Posterior Tibial artery pedal pulses are 2/4 bilateral with immedate capillary fill time. There is no pain with calf compression, swelling, warmth, erythema.   Neruologic: Grossly intact via light touch bilateral.   Musculoskeletal: There is tenderness palpation just along the second metatarsal mild discomfort vibratory sensation.  There is mild edema there is no erythema or warmth.  No other areas of pinpoint tenderness.  Flexor, extensor tendons.  Intact.  MMT 5/5.  Hammertoe right foot  Gait: Unassisted, Nonantalgic.       Assessment:   Concern for stress fracture to the left second metatarsal, right hammertoe     Plan:  -  Treatment options discussed including all alternatives, risks, and complications -Etiology of symptoms were discussed -Independent review the x-rays.  There is no evidence of acute fracture or stress fracture.  However clinically she is tender just, submetatarsal there is mild pain with vibratory sensation.  Given her symptoms I held off on steroid injection and recommended immobilization in the cam boot.  This was dispensed today.  Encouraged ice and elevation.  I will repeat the x-rays next couple weeks and if normal consider steroid injection if needed. -Offloading for the hammertoe  Trula Slade DPM

## 2021-02-21 ENCOUNTER — Ambulatory Visit: Payer: Medicare Other | Admitting: Podiatry

## 2021-02-27 ENCOUNTER — Ambulatory Visit (INDEPENDENT_AMBULATORY_CARE_PROVIDER_SITE_OTHER): Payer: Medicare Other

## 2021-02-27 ENCOUNTER — Ambulatory Visit: Payer: Medicare Other | Admitting: Podiatry

## 2021-02-27 ENCOUNTER — Other Ambulatory Visit: Payer: Self-pay

## 2021-02-27 DIAGNOSIS — M84375A Stress fracture, left foot, initial encounter for fracture: Secondary | ICD-10-CM

## 2021-02-27 DIAGNOSIS — M84375D Stress fracture, left foot, subsequent encounter for fracture with routine healing: Secondary | ICD-10-CM

## 2021-02-27 DIAGNOSIS — B351 Tinea unguium: Secondary | ICD-10-CM | POA: Diagnosis not present

## 2021-03-03 NOTE — Progress Notes (Signed)
Subjective: 70 year old female presents the office today for evaluation of left foot pain.  She says overall she is feeling better.  She said some occasional discomfort.  She wear the cam boot for the first 2 weeks before getting to the shoe.  She has some minimal discomfort but not like before.  Also has nocturnal treatment options. Denies any systemic complaints such as fevers, chills, nausea, vomiting. No acute changes since last appointment, and no other complaints at this time.   Objective: AAO x3, NAD DP/PT pulses palpable bilaterally, CRT less than 3 seconds On the left foot there is minimal discomfort identified mostly on the first interspace.  There is no significant there is no erythema or warmth there is no pain with range of motion.  Flexor, extensor tendons appear to be intact.  MMT 5/5. Nails are hypertrophic and dystrophic with yellow discoloration but there is no pain in the nails there is no redness or drainage or any signs of infection. No pain with calf compression, swelling, warmth, erythema  Assessment: Resolving stress fracture left foot; onychomycosis  Plan: -All treatment options discussed with the patient including all alternatives, risks, complications.  -Repeat x-rays did not reveal any evidence of acute fracture or stress fracture.  Clinically I still think that she had a stress fracture which is still in healing.  She can continue with a regular, supportive shoe if there is any increasing pain she is to return to the cam boot.  If she continues to have symptoms in the right repeat x-rays are negative consider injection of the first interspace. -Discussed treatment options for nail fungus including oral, topical treatment as well as alternative treatments.  Will [facility] compound cream today [facility]. -Patient encouraged to call the office with any questions, concerns, change in symptoms.   Trula Slade DPM

## 2021-03-04 ENCOUNTER — Ambulatory Visit (INDEPENDENT_AMBULATORY_CARE_PROVIDER_SITE_OTHER): Payer: Medicare Other

## 2021-03-04 VITALS — Ht 69.0 in | Wt 193.0 lb

## 2021-03-04 DIAGNOSIS — Z0000 Encounter for general adult medical examination without abnormal findings: Secondary | ICD-10-CM

## 2021-03-04 NOTE — Patient Instructions (Signed)
Samantha Olson , Thank you for taking time to come for your Medicare Wellness Visit. I appreciate your ongoing commitment to your health goals. Please review the following plan we discussed and let me know if I can assist you in the future.   Screening recommendations/referrals: Colonoscopy: Done 10/28/2018 - Repeat in 10 years Mammogram: Done 05/18/2018 - Past due for annual repeat - make appointment soon Bone Density: Done 08/22/2018 - Repeat every 2 years *do at next visit Recommended yearly ophthalmology/optometry visit for glaucoma screening and checkup Recommended yearly dental visit for hygiene and checkup  Vaccinations: Influenza vaccine: Done 08/06/2020 - Repeat annually Pneumococcal vaccine: Done 07/28/2017 & 08/22/2018 Tdap vaccine: Done 12/10/2020 - Repeat in 10 years Shingles vaccine: DUE Shingrix discussed. Please contact your pharmacy for coverage information.    Covid-19: J&J done 01/20/2020 & Pfizer booster 08/30/2020  Advanced directives: Advance directive discussed with you today. I have provided a copy for you to complete at home and have notarized. Once this is complete please bring a copy in to our office so we can scan it into your chart.  Conditions/risks identified: Aim for 30 minutes of exercise or brisk walking each day, drink 6-8 glasses of water and eat lots of fruits and vegetables.  Next appointment: Follow up in one year for your annual wellness visit    Preventive Care 65 Years and Older, Female Preventive care refers to lifestyle choices and visits with your health care provider that can promote health and wellness. What does preventive care include?  A yearly physical exam. This is also called an annual well check.  Dental exams once or twice a year.  Routine eye exams. Ask your health care provider how often you should have your eyes checked.  Personal lifestyle choices, including:  Daily care of your teeth and gums.  Regular physical activity.  Eating  a healthy diet.  Avoiding tobacco and drug use.  Limiting alcohol use.  Practicing safe sex.  Taking low-dose aspirin every day.  Taking vitamin and mineral supplements as recommended by your health care provider. What happens during an annual well check? The services and screenings done by your health care provider during your annual well check will depend on your age, overall health, lifestyle risk factors, and family history of disease. Counseling  Your health care provider may ask you questions about your:  Alcohol use.  Tobacco use.  Drug use.  Emotional well-being.  Home and relationship well-being.  Sexual activity.  Eating habits.  History of falls.  Memory and ability to understand (cognition).  Work and work Statistician.  Reproductive health. Screening  You may have the following tests or measurements:  Height, weight, and BMI.  Blood pressure.  Lipid and cholesterol levels. These may be checked every 5 years, or more frequently if you are over 13 years old.  Skin check.  Lung cancer screening. You may have this screening every year starting at age 81 if you have a 30-pack-year history of smoking and currently smoke or have quit within the past 15 years.  Fecal occult blood test (FOBT) of the stool. You may have this test every year starting at age 65.  Flexible sigmoidoscopy or colonoscopy. You may have a sigmoidoscopy every 5 years or a colonoscopy every 10 years starting at age 93.  Hepatitis C blood test.  Hepatitis B blood test.  Sexually transmitted disease (STD) testing.  Diabetes screening. This is done by checking your blood sugar (glucose) after you have not eaten for  a while (fasting). You may have this done every 1-3 years.  Bone density scan. This is done to screen for osteoporosis. You may have this done starting at age 63.  Mammogram. This may be done every 1-2 years. Talk to your health care provider about how often you should  have regular mammograms. Talk with your health care provider about your test results, treatment options, and if necessary, the need for more tests. Vaccines  Your health care provider may recommend certain vaccines, such as:  Influenza vaccine. This is recommended every year.  Tetanus, diphtheria, and acellular pertussis (Tdap, Td) vaccine. You may need a Td booster every 10 years.  Zoster vaccine. You may need this after age 12.  Pneumococcal 13-valent conjugate (PCV13) vaccine. One dose is recommended after age 75.  Pneumococcal polysaccharide (PPSV23) vaccine. One dose is recommended after age 108. Talk to your health care provider about which screenings and vaccines you need and how often you need them. This information is not intended to replace advice given to you by your health care provider. Make sure you discuss any questions you have with your health care provider. Document Released: 10/25/2015 Document Revised: 06/17/2016 Document Reviewed: 07/30/2015 Elsevier Interactive Patient Education  2017 [place] Prevention in the Home Falls can cause injuries. They can happen to people of all ages. There are many things you can do to make your home safe and to help prevent falls. What can I do on the outside of my home?  Regularly fix the edges of walkways and driveways and fix any cracks.  Remove anything that might make you trip as you walk through a door, such as a raised step or threshold.  Trim any bushes or trees on the path to your home.  Use bright outdoor lighting.  Clear any walking paths of anything that might make someone trip, such as rocks or tools.  Regularly check to see if handrails are loose or broken. Make sure that both sides of any steps have handrails.  Any raised decks and porches should have guardrails on the edges.  Have any leaves, snow, or ice cleared regularly.  Use sand or salt on walking paths during winter.  Clean up any spills in  your garage right away. This includes oil or grease spills. What can I do in the bathroom?  Use night lights.  Install grab bars by the toilet and in the tub and shower. Do not use towel bars as grab bars.  Use non-skid mats or decals in the tub or shower.  If you need to sit down in the shower, use a plastic, non-slip stool.  Keep the floor dry. Clean up any water that spills on the floor as soon as it happens.  Remove soap buildup in the tub or shower regularly.  Attach bath mats securely with double-sided non-slip rug tape.  Do not have throw rugs and other things on the floor that can make you trip. What can I do in the bedroom?  Use night lights.  Make sure that you have a light by your bed that is easy to reach.  Do not use any sheets or blankets that are too big for your bed. They should not hang down onto the floor.  Have a firm chair that has side arms. You can use this for support while you get dressed.  Do not have throw rugs and other things on the floor that can make you trip. What can I do in  the kitchen?  Clean up any spills right away.  Avoid walking on wet floors.  Keep items that you use a lot in easy-to-reach places.  If you need to reach something above you, use a strong step stool that has a grab bar.  Keep electrical cords out of the way.  Do not use floor polish or wax that makes floors slippery. If you must use wax, use non-skid floor wax.  Do not have throw rugs and other things on the floor that can make you trip. What can I do with my stairs?  Do not leave any items on the stairs.  Make sure that there are handrails on both sides of the stairs and use them. Fix handrails that are broken or loose. Make sure that handrails are as long as the stairways.  Check any carpeting to make sure that it is firmly attached to the stairs. Fix any carpet that is loose or worn.  Avoid having throw rugs at the top or bottom of the stairs. If you do have  throw rugs, attach them to the floor with carpet tape.  Make sure that you have a light switch at the top of the stairs and the bottom of the stairs. If you do not have them, ask someone to add them for you. What else can I do to help prevent falls?  Wear shoes that:  Do not have high heels.  Have rubber bottoms.  Are comfortable and fit you well.  Are closed at the toe. Do not wear sandals.  If you use a stepladder:  Make sure that it is fully opened. Do not climb a closed stepladder.  Make sure that both sides of the stepladder are locked into place.  Ask someone to hold it for you, if possible.  Clearly mark and make sure that you can see:  Any grab bars or handrails.  First and last steps.  Where the edge of each step is.  Use tools that help you move around (mobility aids) if they are needed. These include:  Canes.  Walkers.  Scooters.  Crutches.  Turn on the lights when you go into a dark area. Replace any light bulbs as soon as they burn out.  Set up your furniture so you have a clear path. Avoid moving your furniture around.  If any of your floors are uneven, fix them.  If there are any pets around you, be aware of where they are.  Review your medicines with your doctor. Some medicines can make you feel dizzy. This can increase your chance of falling. Ask your doctor what other things that you can do to help prevent falls. This information is not intended to replace advice given to you by your health care provider. Make sure you discuss any questions you have with your health care provider. Document Released: 07/25/2009 Document Revised: 03/05/2016 Document Reviewed: 11/02/2014 Elsevier Interactive Patient Education  2017 [employer].

## 2021-03-04 NOTE — Progress Notes (Signed)
Subjective:   Samantha Olson is a 70 y.o. female who presents for an Initial Medicare Annual Wellness Visit.  Virtual Visit via Telephone Note  I connected with  Samantha Olson on 03/04/21 at  2:45 PM EDT by telephone and verified that I am speaking with the correct person using two identifiers.  Location: Patient: Home Provider: [facility] Persons participating in the virtual visit: patient/Nurse Health Advisor   I discussed the limitations, risks, security and privacy concerns of performing an evaluation and management service by telephone and the availability of in person appointments. The patient expressed understanding and agreed to proceed.  Interactive audio and video telecommunications were attempted between this nurse and patient, however failed, due to patient having technical difficulties OR patient did not have access to video capability.  We continued and completed visit with audio only.  Some vital signs may be absent or patient reported.   Jameir Ake E Gordie Belvin, LPN   Review of Systems     Cardiac Risk Factors include: advanced age (>9men, >41 women);dyslipidemia;hypertension     Objective:    Today's Vitals   03/04/21 1441 03/04/21 1442  Weight: 193 lb (87.5 kg)   Height: 5\' 9"  (1.753 m)   PainSc:  4    Body mass index is 28.5 kg/m.  Advanced Directives 03/04/2021 08/29/2018  Does Patient Have a Medical Advance Directive? No No  Would patient like information on creating a medical advance directive? Yes (MAU/Ambulatory/Procedural Areas - Information given) -    Current Medications (verified) Outpatient Encounter Medications as of 03/04/2021  Medication Sig  . atorvastatin (LIPITOR) 20 MG tablet Take 1 tablet (20 mg total) by mouth daily.  . Cholecalciferol (VITAMIN D) 2000 UNITS CAPS Take by mouth. 1 daily  . cyclobenzaprine (FLEXERIL) 5 MG tablet Take 1 tablet (5 mg total) by mouth 3 (three) times daily as needed. for muscle spams (Patient not taking: Reported on  01/27/2021)  . fluorouracil (EFUDEX) 5 % cream fluorouracil 5 % topical cream  . fluticasone (FLONASE) 50 MCG/ACT nasal spray Place 2 sprays into both nostrils daily. (Patient not taking: Reported on 01/27/2021)  . losartan (COZAAR) 100 MG tablet Take 1 tablet (100 mg total) by mouth daily.  . naproxen (NAPROSYN) 500 MG tablet Take 1 tablet (500 mg total) by mouth 2 (two) times daily with a meal. (prn pain/ swelling)  . NON [facility]  Antifungal (nail)-#1  . sertraline (ZOLOFT) 50 MG tablet Take 1 tablet (50 mg total) by mouth daily.   No facility-administered encounter medications on file as of 03/04/2021.    Allergies (verified) Ace inhibitors, Celexa [citalopram hydrobromide], and Penicillins   History: Past Medical History:  Diagnosis Date  . Cataract   . Chronic kidney disease 1977   hx ofbladder tumor and cyst on kidney  . GERD (gastroesophageal reflux disease)   . H. pylori infection   . Hiatal hernia   . Hyperlipidemia   . Hypertension   . Vitamin D deficiency    Past Surgical History:  Procedure Laterality Date  . Bladder tumor removed    . JOINT REPLACEMENT Left 9/13   knee  . skin ca removal     Family History  Problem Relation Age of Onset  . Cancer Father        stomach cancer  . Hypertension Mother   . Hyperlipidemia Mother   . Stroke Maternal Grandmother   . Hypertension Maternal Grandmother    Social History   Socioeconomic History  .  Marital status: Widowed    Spouse name: Not on file  . Number of children: 5  . Years of education: Not on file  . Highest education level: Not on file  Occupational History  . Occupation: retired  Tobacco Use  . Smoking status: Never Smoker  . Smokeless tobacco: Never Used  Vaping Use  . Vaping Use: Never used  Substance and Sexual Activity  . Alcohol use: Yes    Comment: rare use  . Drug use: No  . Sexual activity: Not Currently  Other Topics Concern  . Not on file  Social History  Narrative   Originally from [place] - 2 children live with her; other 3 children live in [place]; (Enjoys [place] - does this rain or shine) - Her husband passed of covid in 2020 - doesn't care for social groups or activities    Social Determinants of Health   Financial Resource Strain: [facility]   . Difficulty of Paying Living Expenses: Not hard at all  Food Insecurity: No Food Insecurity  . Worried About Charity fundraiser in the Last Year: Never true  . Ran Out of Food in the Last Year: Never true  Transportation Needs: No Transportation Needs  . Lack of Transportation (Medical): No  . Lack of Transportation (Non-Medical): No  Physical Activity: Sufficiently Active  . Days of Exercise per Week: 7 days  . Minutes of Exercise per Session: 30 min  Stress: No Stress Concern Present  . Feeling of Stress : Only a little  Social Connections: Moderately Isolated  . Frequency of Communication with Friends and Family: Twice a week  . Frequency of Social Gatherings with Friends and Family: Once a week  . Attends Religious Services: Never  . Active Member of Clubs or Organizations: No  . Attends Archivist Meetings: Never  . Marital Status: Married    Tobacco Counseling Counseling given: Not Answered   Clinical Intake:  Pre-visit preparation completed: Yes  Pain : 0-10 Pain Score: 4  Pain Type: Chronic pain Pain Location: Generalized Pain Orientation: Right,Left Pain Descriptors / Indicators: Aching,Sore Pain Onset: More than a month ago Pain Frequency: Intermittent     BMI - recorded: 28.5 Nutritional Status: BMI 25 -29 Overweight Nutritional Risks: None  How often do you need to have someone help you when you read instructions, pamphlets, or other written materials from your doctor or pharmacy?: 1 - Never  Diabetic? No  Interpreter Needed?: No  Information entered by :: Holbert Caples, LPN   Activities of Daily Living In your present state of health, do  you have any difficulty performing the following activities: 03/04/2021  Hearing? N  Vision? N  Difficulty concentrating or making decisions? N  Walking or climbing stairs? N  Dressing or bathing? N  Doing errands, shopping? N  Preparing Food and eating ? N  Using the Toilet? N  In the past six months, have you accidently leaked urine? Y  Do you have problems with loss of bowel control? N  Managing your Medications? N  Managing your Finances? N  Housekeeping or managing your Housekeeping? N  Some recent data might be hidden    Patient Care Team: [name], DO as PCP - General (Family Medicine) Shona Simpson, MD (Ophthalmology) Trula Slade, DPM as Consulting Physician (Podiatry)  Indicate any recent Medical Services you may have received from other than [facility] providers in the past year (date may be approximate).     Assessment:  This is a routine wellness examination for Teresita.  Hearing/Vision screen  Hearing Screening   125Hz  250Hz  500Hz  1000Hz  2000Hz  3000Hz  4000Hz  6000Hz  8000Hz   Right ear:           Left ear:           Comments: Denies hearing difficulties  Vision Screening Comments: Wears eyeglasses - Routine visits with Dr Jalene Mullet in [place] - up to date with eye exam  Dietary issues and exercise activities discussed: Current Exercise Habits: Home exercise routine, Type of exercise: stretching;walking, Time (Minutes): 30, Frequency (Times/Week): 7, Weekly Exercise (Minutes/Week): 210, Intensity: Mild, Exercise limited by: orthopedic condition(s)  Goals Addressed            This Visit's Progress   . DIET - INCREASE WATER INTAKE        Depression Screen PHQ 2/9 Scores 03/04/2021 01/27/2021 08/06/2020 06/20/2020 06/10/2020 08/29/2019 06/16/2019  PHQ - 2 Score 0 0 3 0 2 2 4   PHQ- 9 Score - - 10 - 9 2 9     Fall Risk Fall Risk  03/04/2021 01/27/2021 08/06/2020 06/20/2020 04/12/2020  Falls in the past year? 1 1 0 0 1  Number falls in past yr: 1 0 - - 0   Injury with Fall? 0 1 - - 0  Risk for fall due to : History of fall(s);Orthopedic patient;Impaired vision History of fall(s) - - History of fall(s)  Follow up Education provided;Falls prevention discussed - - - Falls evaluation completed    FALL RISK PREVENTION PERTAINING TO THE HOME:  Any stairs in or around the home? Yes  If so, are there any without handrails? No  Home free of loose throw rugs in walkways, pet beds, electrical cords, etc? Yes  Adequate lighting in your home to reduce risk of falls? Yes   ASSISTIVE DEVICES UTILIZED TO PREVENT FALLS:  Life alert? No  Use of a cane, walker or w/c? No  Grab bars in the bathroom? Yes  Shower chair or bench in shower? No  Elevated toilet seat or a handicapped toilet? No   TIMED UP AND GO:  Was the test performed? No . Telephonic visit.  Cognitive Function:     6CIT Screen 03/04/2021  What Year? 0 points  What month? 0 points  What time? 0 points  Count back from 20 0 points  Months in reverse 0 points  Repeat phrase 0 points  Total Score 0    Immunizations Immunization History  Administered Date(s) Administered  . DTaP 12/11/2009  . Fluad Quad(high Dose 65+) 06/16/2019, 08/06/2020  . Influenza Split 07/26/2012  . Influenza Whole 08/19/2009  . Influenza, High Dose Seasonal PF 07/28/2017, 08/22/2018  . Influenza,inj,Quad PF,6+ Mos 07/31/2014, 08/09/2015, 10/21/2016  . Janssen (J&J) SARS-COV-2 Vaccination 01/20/2020  . PFIZER(Purple Top)SARS-COV-2 Vaccination 08/30/2020  . Pneumococcal Conjugate-13 07/28/2017  . Pneumococcal Polysaccharide-23 08/22/2018  . Pneumococcal-Unspecified 08/22/2018  . Td 12/13/2009  . Tdap 12/10/2020    TDAP status: Up to date  Flu Vaccine status: Up to date  Pneumococcal vaccine status: Up to date  Covid-19 vaccine status: Completed vaccines  Qualifies for Shingles Vaccine? Yes   Zostavax completed No   Shingrix Completed?: No.    Education has been provided regarding the  importance of this vaccine. Patient has been advised to call insurance company to determine out of pocket expense if they have not yet received this vaccine. Advised may also receive vaccine at local pharmacy or Health Dept. Verbalized acceptance and understanding.  Screening Tests Health Maintenance  Topic Date Due  . MAMMOGRAM  05/19/2019  . DEXA SCAN  08/22/2020  . INFLUENZA VACCINE  05/12/2021  . COLONOSCOPY (Pts 45-67yrs Insurance coverage will need to be confirmed)  10/28/2028  . TETANUS/TDAP  12/11/2030  . COVID-19 Vaccine  Completed  . Hepatitis C Screening  Completed  . PNA vac Low Risk Adult  Completed  . HPV VACCINES  Aged Out    Health Maintenance  Health Maintenance Due  Topic Date Due  . MAMMOGRAM  05/19/2019  . DEXA SCAN  08/22/2020    Colorectal cancer screening: Type of screening: Colonoscopy. Completed 10/28/2018. Repeat every 10 years  Mammogram status: Completed 05/18/2018. Repeat every year - she will make appointment (goes [facility])  Bone Density status: Completed 08/22/2018. Results reflect: Bone density results: NORMAL. Repeat every 2 years.  Lung Cancer Screening: (Low Dose CT Chest recommended if Age 27-80 years, 30 pack-year currently smoking OR have quit w/in 15years.) does not qualify.   Additional Screening:  Hepatitis C Screening: does qualify; Completed 10/21/2016  Vision Screening: Recommended annual ophthalmology exams for early detection of glaucoma and other disorders of the eye. Is the patient up to date with their annual eye exam?  Yes  Who is the provider or what is the name of the office in which the patient attends annual eye exams? [facility] in [place] If pt is not established with a provider, would they like to be referred to a provider to establish care? No .   Dental Screening: Recommended annual dental exams for proper oral hygiene  Community Resource Referral / Chronic Care Management: CRR required this visit?  No   CCM  required this visit?  No      Plan:     I have personally reviewed and noted the following in the patient's chart:   . Medical and social history . Use of alcohol, tobacco or illicit drugs  . Current medications and supplements including opioid prescriptions. Patient is not currently taking opioid prescriptions. . Functional ability and status . Nutritional status . Physical activity . Advanced directives . List of other physicians . Hospitalizations, surgeries, and ER visits in previous 12 months . Vitals . Screenings to include cognitive, depression, and falls . Referrals and appointments  In addition, I have reviewed and discussed with patient certain preventive protocols, quality metrics, and best practice recommendations. A written personalized care plan for preventive services as well as general preventive health recommendations were provided to patient.     Sandrea Hammond, LPN   04/04/7627   Nurse Notes: None

## 2021-03-12 ENCOUNTER — Other Ambulatory Visit: Payer: Self-pay | Admitting: Podiatry

## 2021-04-15 ENCOUNTER — Ambulatory Visit: Payer: Medicare Other | Admitting: Podiatry

## 2021-05-12 ENCOUNTER — Ambulatory Visit (INDEPENDENT_AMBULATORY_CARE_PROVIDER_SITE_OTHER): Payer: Medicare Other | Admitting: Family

## 2021-05-12 ENCOUNTER — Encounter: Payer: Self-pay | Admitting: Family

## 2021-05-12 ENCOUNTER — Other Ambulatory Visit: Payer: Self-pay

## 2021-05-12 VITALS — BP 127/75 | HR 79 | Temp 97.1°F | Ht 69.0 in | Wt 188.4 lb

## 2021-05-12 DIAGNOSIS — R399 Unspecified symptoms and signs involving the genitourinary system: Secondary | ICD-10-CM

## 2021-05-12 LAB — URINALYSIS, COMPLETE
Bilirubin, UA: NEGATIVE
Glucose, UA: NEGATIVE
Ketones, UA: NEGATIVE
Nitrite, UA: NEGATIVE
Protein,UA: NEGATIVE
Specific Gravity, UA: 1.015 (ref 1.005–1.030)
Urobilinogen, Ur: 0.2 mg/dL (ref 0.2–1.0)
pH, UA: 5 (ref 5.0–7.5)

## 2021-05-12 LAB — MICROSCOPIC EXAMINATION
Epithelial Cells (non renal): NONE SEEN /hpf (ref 0–10)
Renal Epithel, UA: NONE SEEN /hpf
WBC, UA: >30 /hpf — AB (ref 0–5)

## 2021-05-12 MED ORDER — DOXYCYCLINE HYCLATE 100 MG PO TABS: 100.0000 mg | ORAL_TABLET | Freq: Two times a day (BID) | ORAL | 0 refills | Status: DC

## 2021-05-12 NOTE — Progress Notes (Signed)
   Subjective:    Patient ID: Samantha Olson, female    DOB: 11-Dec-1950, 70 y.o.   MRN: [id number]  Chief Complaint  Patient presents with   Dysuria   Urinary Frequency    Dysuria  This is a new problem. The current episode started in the past 7 days. The problem occurs every urination. The problem has been unchanged. The quality of the pain is described as burning. The pain is at a severity of 4/10. The pain is mild. There has been no fever. Associated symptoms include frequency, hesitancy and urgency. Pertinent negatives include no discharge, flank pain, hematuria, nausea or vomiting. She has tried increased fluids for the symptoms. The treatment provided mild relief.  Urinary Frequency  Associated symptoms include frequency, hesitancy and urgency. Pertinent negatives include no discharge, flank pain, hematuria, nausea or vomiting.     Review of Systems  Gastrointestinal:  Negative for nausea and vomiting.  Genitourinary:  Positive for dysuria, frequency, hesitancy and urgency. Negative for flank pain and hematuria.  All other systems reviewed and are negative.     Objective:   Physical Exam Vitals reviewed.  Constitutional:      General: She is not in acute distress.    Appearance: She is well-developed.  HENT:     Head: Normocephalic and atraumatic.  Eyes:     Pupils: Pupils are equal, round, and reactive to light.  Neck:     Thyroid: No thyromegaly.  Cardiovascular:     Rate and Rhythm: Normal rate and regular rhythm.     Heart sounds: Normal heart sounds. No murmur heard. Pulmonary:     Effort: Pulmonary effort is normal. No respiratory distress.     Breath sounds: Normal breath sounds. No wheezing.  Abdominal:     General: Bowel sounds are normal. There is no distension.     Palpations: Abdomen is soft.     Tenderness: There is no abdominal tenderness.  Musculoskeletal:        General: No tenderness. Normal range of motion.     Cervical back: Normal range of motion  and neck supple.  Skin:    General: Skin is warm and dry.  Neurological:     Mental Status: She is alert and oriented to person, place, and time.     Cranial Nerves: No cranial nerve deficit.     Deep Tendon Reflexes: Reflexes are normal and symmetric.  Psychiatric:        Behavior: Behavior normal.        Thought Content: Thought content normal.        Judgment: Judgment normal.      BP 127/75   Pulse 79   Temp (!) 97.1 F (36.2 C) (Temporal)   Ht '5\' 9"'$  (1.753 m)   Wt 188 lb 6.4 oz (85.5 kg)   BMI 27.82 kg/m      Assessment & Plan:  Samantha Olson comes in today with chief complaint of Dysuria and Urinary Frequency   Diagnosis and orders addressed:  1. UTI symptoms Force fluids AZO over the counter X2 days RTO if symptoms worsen or do not improve  Culture pending - Urinalysis, Complete - Urine Culture - doxycycline (VIBRA-TABS) 100 MG tablet; Take 1 tablet (100 mg total) by mouth 2 (two) times daily.  Dispense: 20 tablet; Refill: 0   Evelina Dun, FNP

## 2021-05-12 NOTE — Patient Instructions (Signed)
Urinary Tract Infection, Adult A urinary tract infection (UTI) is an infection of any part of the urinary tract. The urinary tract includes the kidneys, ureters, bladder, and urethra. These organs make, store, and get rid of urine in the body. An upper UTI affects the ureters and kidneys. A lower UTI affects the bladder and urethra. What are the causes? Most urinary tract infections are caused by bacteria in your genital area around your urethra, where urine leaves your body. These bacteria grow and cause inflammation of your urinary tract. What increases the risk? You are more likely to develop this condition if: You have a urinary catheter that stays in place. You are not able to control when you urinate or have a bowel movement (incontinence). You are female and you: Use a spermicide or diaphragm for birth control. Have low estrogen levels. Are pregnant. You have certain genes that increase your risk. You are sexually active. You take antibiotic medicines. You have a condition that causes your flow of urine to slow down, such as: An enlarged prostate, if you are female. Blockage in your urethra. A kidney stone. A nerve condition that affects your bladder control (neurogenic bladder). Not getting enough to drink, or not urinating often. You have certain medical conditions, such as: Diabetes. A weak disease-fighting system (immunesystem). Sickle cell disease. Gout. Spinal cord injury. What are the signs or symptoms? Symptoms of this condition include: Needing to urinate right away (urgency). Frequent urination. This may include small amounts of urine each time you urinate. Pain or burning with urination. Blood in the urine. Urine that smells bad or unusual. Trouble urinating. Cloudy urine. Vaginal discharge, if you are female. Pain in the abdomen or the lower back. You may also have: Vomiting or a decreased appetite. Confusion. Irritability or tiredness. A fever or  chills. Diarrhea. The first symptom in older adults may be confusion. In some cases, they may not have any symptoms until the infection has worsened. How is this diagnosed? This condition is diagnosed based on your medical history and a physical exam. You may also have other tests, including: Urine tests. Blood tests. Tests for STIs (sexually transmitted infections). If you have had more than one UTI, a cystoscopy or imaging studies may be done to determine the cause of the infections. How is this treated? Treatment for this condition includes: Antibiotic medicine. Over-the-counter medicines to treat discomfort. Drinking enough water to stay hydrated. If you have frequent infections or have other conditions such as a kidney stone, you may need to see a health care provider who specializes in the urinary tract (urologist). In rare cases, urinary tract infections can cause sepsis. Sepsis is a life-threatening condition that occurs when the body responds to an infection. Sepsis is treated in the hospital with IV antibiotics, fluids, and other medicines. Follow these instructions at home: Medicines Take over-the-counter and prescription medicines only as told by your health care provider. If you were prescribed an antibiotic medicine, take it as told by your health care provider. Do not stop using the antibiotic even if you start to feel better. General instructions Make sure you: Empty your bladder often and completely. Do not hold urine for long periods of time. Empty your bladder after sex. Wipe from front to back after urinating or having a bowel movement if you are female. Use each tissue only one time when you wipe. Drink enough fluid to keep your urine pale yellow. Keep all follow-up visits. This is important. Contact a health care provider   if: Your symptoms do not get better after 1-2 days. Your symptoms go away and then return. Get help right away if: You have severe pain in your  back or your lower abdomen. You have a fever or chills. You have nausea or vomiting. Summary A urinary tract infection (UTI) is an infection of any part of the urinary tract, which includes the kidneys, ureters, bladder, and urethra. Most urinary tract infections are caused by bacteria in your genital area. Treatment for this condition often includes antibiotic medicines. If you were prescribed an antibiotic medicine, take it as told by your health care provider. Do not stop using the antibiotic even if you start to feel better. Keep all follow-up visits. This is important. This information is not intended to replace advice given to you by your health care provider. Make sure you discuss any questions you have with your health care provider. Document Revised: 05/10/2020 Document Reviewed: 05/10/2020 Elsevier Patient Education  2022 Elsevier Inc.  

## 2021-05-14 LAB — URINE CULTURE

## 2021-07-08 DIAGNOSIS — L570 Actinic keratosis: Secondary | ICD-10-CM | POA: Diagnosis not present

## 2021-07-08 DIAGNOSIS — Z1231 Encounter for screening mammogram for malignant neoplasm of breast: Secondary | ICD-10-CM | POA: Diagnosis not present

## 2021-07-23 ENCOUNTER — Other Ambulatory Visit: Payer: Self-pay | Admitting: Family Medicine

## 2021-07-23 DIAGNOSIS — F339 Major depressive disorder, recurrent, unspecified: Secondary | ICD-10-CM

## 2021-07-23 NOTE — Telephone Encounter (Signed)
Patient last seen for follow up 08/06/20. Patient must be seen for any further refills.

## 2021-08-04 ENCOUNTER — Other Ambulatory Visit: Payer: Self-pay | Admitting: Family Medicine

## 2021-08-04 DIAGNOSIS — F339 Major depressive disorder, recurrent, unspecified: Secondary | ICD-10-CM

## 2021-08-27 ENCOUNTER — Ambulatory Visit (INDEPENDENT_AMBULATORY_CARE_PROVIDER_SITE_OTHER): Payer: Medicare Other

## 2021-08-27 ENCOUNTER — Encounter: Payer: Self-pay | Admitting: Nurse Practitioner

## 2021-08-27 ENCOUNTER — Other Ambulatory Visit: Payer: Self-pay

## 2021-08-27 ENCOUNTER — Ambulatory Visit (INDEPENDENT_AMBULATORY_CARE_PROVIDER_SITE_OTHER): Payer: Medicare Other | Admitting: Nurse Practitioner

## 2021-08-27 VITALS — BP 138/89 | HR 82 | Temp 97.1°F | Resp 20 | Ht 69.0 in | Wt 193.0 lb

## 2021-08-27 DIAGNOSIS — M5489 Other dorsalgia: Secondary | ICD-10-CM

## 2021-08-27 DIAGNOSIS — M545 Low back pain, unspecified: Secondary | ICD-10-CM | POA: Diagnosis not present

## 2021-08-27 MED ORDER — METHOCARBAMOL 500 MG PO TABS: 500.0000 mg | ORAL_TABLET | Freq: Four times a day (QID) | ORAL | 0 refills | Status: DC

## 2021-08-27 MED ORDER — DICLOFENAC SODIUM 1 % EX GEL: 2.0000 g | Freq: Four times a day (QID) | CUTANEOUS | 0 refills | Status: DC

## 2021-08-27 MED ORDER — METHYLPREDNISOLONE ACETATE 40 MG/ML IJ SUSP
40.0000 mg | Freq: Once | INTRAMUSCULAR | Status: AC
  Administered 2021-08-27: 40 mg via INTRAMUSCULAR

## 2021-08-27 NOTE — Patient Instructions (Signed)
Chronic Back Pain When back pain lasts longer than 3 months, it is called chronic back pain. Pain may get worse at certain times (flare-ups). There are things you can do at home to manage your pain. Follow these instructions at home: Pay attention to any changes in your symptoms. Take these actions to help with your pain: Managing pain and stiffness   If told, put ice on the painful area. Your doctor may tell you to use ice for 24-48 hours after the flare-up starts. To do this: Put ice in a plastic bag. Place a towel between your skin and the bag. Leave the ice on for 20 minutes, 2-3 times a day. If told, put heat on the painful area. Do this as often as told by your doctor. Use the heat source that your doctor recommends, such as a moist heat pack or a heating pad. Place a towel between your skin and the heat source. Leave the heat on for 20-30 minutes. Take off the heat if your skin turns bright red. This is especially important if you are unable to feel pain, heat, or cold. You may have a greater risk of getting burned. Soak in a warm bath. This can help relieve pain. Activity  Avoid bending and other activities that make pain worse. When standing: Keep your upper back and neck straight. Keep your shoulders pulled back. Avoid slouching. When sitting: Keep your back straight. Relax your shoulders. Do not round your shoulders or pull them backward. Do not sit or stand in one place for long periods of time. Take short rest breaks during the day. Lying down or standing is usually better than sitting. Resting can help relieve pain. When sitting or lying down for a long time, do some mild activity or stretching. This will help to prevent stiffness and pain. Get regular exercise. Ask your doctor what activities are safe for you. Do not lift anything that is heavier than 10 lb (4.5 kg) or the limit that you are told, until your doctor says that it is safe. To prevent injury when you lift  things: Bend your knees. Keep the weight close to your body. Avoid twisting. Sleep on a firm mattress. Try lying on your side with your knees slightly bent. If you lie on your back, put a pillow under your knees. Medicines Treatment may include medicines for pain and swelling taken by mouth or put on the skin, prescription pain medicine, or muscle relaxants. Take over-the-counter and prescription medicines only as told by your doctor. Ask your doctor if the medicine prescribed to you: Requires you to avoid driving or using machinery. Can cause trouble pooping (constipation). You may need to take these actions to prevent or treat trouble pooping: Drink enough fluid to keep your pee (urine) pale yellow. Take over-the-counter or prescription medicines. Eat foods that are high in fiber. These include beans, whole grains, and fresh fruits and vegetables. Limit foods that are high in fat and sugars. These include fried or sweet foods. General instructions Do not use any products that contain nicotine or tobacco, such as cigarettes, e-cigarettes, and chewing tobacco. If you need help quitting, ask your doctor. Keep all follow-up visits as told by your doctor. This is important. Contact a doctor if: Your pain does not get better with rest or medicine. Your pain gets worse, or you have new pain. You have a high fever. You lose weight very quickly. You have trouble doing your normal activities. Get help right away if: One   or both of your legs or feet feel weak. One or both of your legs or feet lose feeling (have numbness). You have trouble controlling when you poop (have a bowel movement) or pee (urinate). You have bad back pain and: You feel like you may vomit (nauseous), or you vomit. You have pain in your belly (abdomen). You have shortness of breath. You faint. Summary When back pain lasts longer than 3 months, it is called chronic back pain. Pain may get worse at certain times  (flare-ups). Use ice and heat as told by your doctor. Your doctor may tell you to use ice after flare-ups. This information is not intended to replace advice given to you by your health care provider. Make sure you discuss any questions you have with your health care provider. Document Revised: 11/08/2019 Document Reviewed: 11/08/2019 Elsevier Patient Education  2022 Elsevier Inc.  

## 2021-08-27 NOTE — Progress Notes (Signed)
Acute Office Visit  Subjective:    Patient ID: Samantha Olson, female    DOB: 1951/02/03, 70 y.o.   MRN: [id number]  Chief Complaint  Patient presents with   Back Pain     Back Pain This is a chronic problem. The current episode started in the past 7 days. The problem occurs constantly. The problem is unchanged. The pain is present in the lumbar spine. The pain does not radiate. The pain is at a severity of 6/10. The pain is moderate. The symptoms are aggravated by bending. Stiffness is present All day. Pertinent negatives include no abdominal pain, bladder incontinence, numbness or paresis. She has tried nothing for the symptoms.    Past Medical History:  Diagnosis Date   Cataract    Chronic kidney disease 1977   hx ofbladder tumor and cyst on kidney   GERD (gastroesophageal reflux disease)    H. pylori infection    Hiatal hernia    Hyperlipidemia    Hypertension    Vitamin D deficiency     Past Surgical History:  Procedure Laterality Date   Bladder tumor removed     JOINT REPLACEMENT Left 9/13   knee   skin ca removal      Family History  Problem Relation Age of Onset   Cancer Father        stomach cancer   Hypertension Mother    Hyperlipidemia Mother    Stroke Maternal Grandmother    Hypertension Maternal Grandmother     Social History   Socioeconomic History   Marital status: Widowed    Spouse name: Not on file   Number of children: 5   Years of education: Not on file   Highest education level: Not on file  Occupational History   Occupation: retired  Tobacco Use   Smoking status: Never   Smokeless tobacco: Never  Vaping Use   Vaping Use: Never used  Substance and Sexual Activity   Alcohol use: Yes    Comment: rare use   Drug use: No   Sexual activity: Not Currently  Other Topics Concern   Not on file  Social History Narrative   Originally from [place] - 2 children live with her; other 3 children live in [place]; (Enjoys Environmental education officer - does this  rain or shine) - Her husband passed of covid in 2020 - doesn't care for social groups or activities    Social Determinants of Radio broadcast assistant Strain: Low Risk    Difficulty of Paying Living Expenses: Not hard at all  Food Insecurity: No Food Insecurity   Worried About Charity fundraiser in the Last Year: Never true   Arboriculturist in the Last Year: Never true  Transportation Needs: No Transportation Needs   Lack of Transportation (Medical): No   Lack of Transportation (Non-Medical): No  Physical Activity: Sufficiently Active   Days of Exercise per Week: 7 days   Minutes of Exercise per Session: 30 min  Stress: No Stress Concern Present   Feeling of Stress : Only a little  Social Connections: Moderately Isolated   Frequency of Communication with Friends and Family: Twice a week   Frequency of Social Gatherings with Friends and Family: Once a week   Attends Religious Services: Never   Marine scientist or Organizations: No   Attends Archivist Meetings: Never   Marital Status: Married  Human resources officer Violence: Not At Risk   Fear of Current or  Ex-Partner: No   Emotionally Abused: No   Physically Abused: No   Sexually Abused: No    Outpatient Medications Prior to Visit  Medication Sig Dispense Refill   atorvastatin (LIPITOR) 20 MG tablet Take 1 tablet (20 mg total) by mouth daily. (NEEDS TO BE SEEN BEFORE NEXT REFILL) 30 tablet 0   Cholecalciferol (VITAMIN D) 2000 UNITS CAPS Take by mouth. 1 daily     cyclobenzaprine (FLEXERIL) 5 MG tablet Take 1 tablet (5 mg total) by mouth 3 (three) times daily as needed. for muscle spams 30 tablet 2   fluorouracil (EFUDEX) 5 % cream fluorouracil 5 % topical cream     losartan (COZAAR) 100 MG tablet Take 1 tablet (100 mg total) by mouth daily. 90 tablet 1   NON FORMULARY [facility]  Antifungal (nail)-#1     sertraline (ZOLOFT) 50 MG tablet Take 1 tablet (50 mg total) by mouth daily. (NEEDS TO BE SEEN  BEFORE NEXT REFILL) 30 tablet 0   fluticasone (FLONASE) 50 MCG/ACT nasal spray Place 2 sprays into both nostrils daily. (Patient not taking: Reported on 08/27/2021) 16 g 6   naproxen (NAPROSYN) 500 MG tablet Take 1 tablet (500 mg total) by mouth 2 (two) times daily with a meal. (prn pain/ swelling) (Patient not taking: Reported on 08/27/2021) 60 tablet 0   doxycycline (VIBRA-TABS) 100 MG tablet Take 1 tablet (100 mg total) by mouth 2 (two) times daily. 20 tablet 0   No facility-administered medications prior to visit.    Allergies  Allergen Reactions   Ace Inhibitors Cough   Celexa [Citalopram Hydrobromide]     Shakes- chest and arms   Penicillins Swelling    Review of Systems  Constitutional: Negative.   HENT: Negative.    Respiratory: Negative.    Gastrointestinal:  Negative for abdominal pain.  Genitourinary:  Negative for bladder incontinence.  Musculoskeletal:  Positive for back pain.  Neurological:  Negative for numbness.  All other systems reviewed and are negative.     Objective:    Physical Exam Vitals and nursing note reviewed.  Constitutional:      Appearance: Normal appearance.  HENT:     Head: Normocephalic.     Right Ear: External ear normal.     Left Ear: External ear normal.     Nose: Nose normal.  Eyes:     Conjunctiva/sclera: Conjunctivae normal.  Cardiovascular:     Pulses: Normal pulses.     Heart sounds: Normal heart sounds.  Pulmonary:     Effort: Pulmonary effort is normal.     Breath sounds: Normal breath sounds.  Abdominal:     General: Bowel sounds are normal.  Musculoskeletal:     Lumbar back: Tenderness present. Decreased range of motion.  Skin:    General: Skin is warm.     Findings: No rash.  Neurological:     Mental Status: She is alert.    BP 138/89   Pulse 82   Temp (!) 97.1 F (36.2 C) (Temporal)   Resp 20   Ht _0  (1.753 m)   Wt 193 lb (87.5 kg)   SpO2 97%   BMI 28.50 kg/m  Wt Readings from Last 3 Encounters:   08/27/21 193 lb (87.5 kg)  05/12/21 188 lb 6.4 oz (85.5 kg)  03/04/21 193 lb (87.5 kg)    Health Maintenance Due  Topic Date Due   Zoster Vaccines- Shingrix (1 of 2) Never done   Pneumonia Vaccine 46+ Years old (2 -  PPSV23 if available, else PCV20) 08/23/2019   DEXA SCAN  08/22/2020   COVID-19 Vaccine (3 - Booster for Janssen series) 10/25/2020    There are no preventive care reminders to display for this patient.   Lab Results  Component Value Date   TSH 1.590 08/12/2020   Lab Results  Component Value Date   WBC 6.0 08/12/2020   HGB 14.3 08/12/2020   HCT 43.5 08/12/2020   MCV 90 08/12/2020   PLT 247 08/12/2020   Lab Results  Component Value Date   NA 143 01/30/2021   K 4.9 01/30/2021   CO2 23 01/30/2021   GLUCOSE 104 (H) 01/30/2021   BUN 28 (H) 01/30/2021   CREATININE 0.77 01/30/2021   BILITOT 0.5 01/30/2021   ALKPHOS 68 01/30/2021   AST 17 01/30/2021   ALT 19 01/30/2021   PROT 6.1 01/30/2021   ALBUMIN 4.3 01/30/2021   CALCIUM 9.7 01/30/2021   EGFR 84 01/30/2021   Lab Results  Component Value Date   CHOL 156 01/30/2021   Lab Results  Component Value Date   HDL 80 01/30/2021   Lab Results  Component Value Date   LDLCALC 66 01/30/2021   Lab Results  Component Value Date   TRIG 46 01/30/2021   Lab Results  Component Value Date   CHOLHDL 2.0 01/30/2021   Lab Results  Component Value Date   HGBA1C 6.1 08/12/2020       Assessment & Plan:   Problem List Items Addressed This Visit       Other   Back pain without sciatica - Primary    Discontinue Flexeril.  Started patient on Robaxin 500 mg tablet by mouth.  Patient reports taking 10 mg of Flexeril 5 more milligrams higher than prescribed dose without therapeutic effect.  Patient reports lately she has been doing a lot of gardening which caused her symptoms to become worse and not relenting.  She describes her pain as moderate 6 out of 10 from a pain scale of 0-10.  40 Depo-Medrol shot given in  clinic.  Referral to orthopedic completed.  Back x-ray completed results pending.  Education provided to patient with printed handouts given.  Follow-up with unresolved symptoms.      Relevant Medications   methocarbamol (ROBAXIN) 500 MG tablet   diclofenac Sodium (VOLTAREN) 1 % GEL   Other Relevant Orders   AMB referral to orthopedics   DG Lumbar Spine 2-3 Views     Meds ordered this encounter  Medications   methocarbamol (ROBAXIN) 500 MG tablet    Sig: Take 1 tablet (500 mg total) by mouth 4 (four) times daily.    Dispense:  60 tablet    Refill:  0    Order Specific Question:   Supervising Provider    Answer:   Claretta Fraise [[id number]]   diclofenac Sodium (VOLTAREN) 1 % GEL    Sig: Apply 2 g topically 4 (four) times daily.    Dispense:  100 g    Refill:  0    Order Specific Question:   Supervising Provider    Answer:   Claretta Fraise [[id number]]     Ivy Lynn, NP

## 2021-08-27 NOTE — Assessment & Plan Note (Signed)
Discontinue Flexeril.  Started patient on Robaxin 500 mg tablet by mouth.  Patient reports taking 10 mg of Flexeril 5 more milligrams higher than prescribed dose without therapeutic effect.  Patient reports lately she has been doing a lot of gardening which caused her symptoms to become worse and not relenting.  She describes her pain as moderate 6 out of 10 from a pain scale of 0-10.  40 Depo-Medrol shot given in clinic.  Referral to orthopedic completed.  Back x-ray completed results pending.  Education provided to patient with printed handouts given.  Follow-up with unresolved symptoms.

## 2021-08-27 NOTE — Addendum Note (Signed)
Addended by: Michaela Corner on: 08/27/2021 02:46 PM   Modules accepted: Orders

## 2021-08-28 ENCOUNTER — Telehealth: Payer: Self-pay | Admitting: Family Medicine

## 2021-08-28 NOTE — Telephone Encounter (Signed)
Pt called to let us know that she wants her referral sent to [facility] in [place] if possible.   Please advise and call patient with update.

## 2021-09-02 ENCOUNTER — Other Ambulatory Visit: Payer: Self-pay | Admitting: Family Medicine

## 2021-09-02 DIAGNOSIS — M5489 Other dorsalgia: Secondary | ICD-10-CM

## 2021-09-02 NOTE — Telephone Encounter (Signed)
I believe Ardeen Fillers was the ordering provider but I have placed.  Please make sure to include her notes.

## 2021-09-08 ENCOUNTER — Other Ambulatory Visit: Payer: Self-pay | Admitting: Family Medicine

## 2021-09-08 ENCOUNTER — Telehealth: Payer: Self-pay | Admitting: Family Medicine

## 2021-09-08 DIAGNOSIS — F339 Major depressive disorder, recurrent, unspecified: Secondary | ICD-10-CM

## 2021-09-08 NOTE — Telephone Encounter (Signed)
[name]. NTBS 30 days given 08/06/21

## 2021-09-08 NOTE — Telephone Encounter (Signed)
Burned disc and patient aware that it will be up front to pick up

## 2021-09-09 DIAGNOSIS — M5441 Lumbago with sciatica, right side: Secondary | ICD-10-CM | POA: Diagnosis not present

## 2021-09-09 DIAGNOSIS — R6889 Other general symptoms and signs: Secondary | ICD-10-CM | POA: Diagnosis not present

## 2021-09-09 DIAGNOSIS — M47816 Spondylosis without myelopathy or radiculopathy, lumbar region: Secondary | ICD-10-CM | POA: Diagnosis not present

## 2021-09-09 MED ORDER — SERTRALINE HCL 50 MG PO TABS: 50.0000 mg | ORAL_TABLET | Freq: Every day | ORAL | 0 refills | Status: DC

## 2021-09-09 MED ORDER — ATORVASTATIN CALCIUM 20 MG PO TABS: 20.0000 mg | ORAL_TABLET | Freq: Every day | ORAL | 0 refills | Status: DC

## 2021-09-09 NOTE — Addendum Note (Signed)
Addended by: Antonietta Barcelona D on: 09/09/2021 10:12 AM   Modules accepted: Orders

## 2021-09-09 NOTE — Telephone Encounter (Signed)
Made pt appt for 10/28/2021 and left pt message about appt

## 2021-10-22 DIAGNOSIS — M47816 Spondylosis without myelopathy or radiculopathy, lumbar region: Secondary | ICD-10-CM | POA: Diagnosis not present

## 2021-10-22 DIAGNOSIS — M5416 Radiculopathy, lumbar region: Secondary | ICD-10-CM | POA: Diagnosis not present

## 2021-10-28 ENCOUNTER — Encounter: Payer: Self-pay | Admitting: Family Medicine

## 2021-10-28 ENCOUNTER — Ambulatory Visit (INDEPENDENT_AMBULATORY_CARE_PROVIDER_SITE_OTHER): Payer: Medicare Other | Admitting: Family Medicine

## 2021-10-28 VITALS — BP 124/75 | HR 68 | Temp 98.3°F | Ht 69.0 in | Wt 196.8 lb

## 2021-10-28 DIAGNOSIS — I10 Essential (primary) hypertension: Secondary | ICD-10-CM

## 2021-10-28 DIAGNOSIS — F339 Major depressive disorder, recurrent, unspecified: Secondary | ICD-10-CM | POA: Diagnosis not present

## 2021-10-28 DIAGNOSIS — E782 Mixed hyperlipidemia: Secondary | ICD-10-CM | POA: Diagnosis not present

## 2021-10-28 MED ORDER — SERTRALINE HCL 50 MG PO TABS: 50.0000 mg | ORAL_TABLET | Freq: Every day | ORAL | 3 refills | Status: DC

## 2021-10-28 MED ORDER — LOSARTAN POTASSIUM 100 MG PO TABS: 100.0000 mg | ORAL_TABLET | Freq: Every day | ORAL | 3 refills | Status: DC

## 2021-10-28 MED ORDER — ATORVASTATIN CALCIUM 20 MG PO TABS: 20.0000 mg | ORAL_TABLET | Freq: Every day | ORAL | 3 refills | Status: DC

## 2021-10-28 NOTE — Progress Notes (Signed)
Subjective: CC: Hypertension with hyperlipidemia PCP: Samantha Norlander, DO NLG:[id number] Samantha Olson is a 71 y.o. female presenting to clinic today for:  1.  Hypertension with hyperlipidemia Patient is compliant with Cozaar 100 mg daily, Lipitor 20 mg daily.  She had fasting labs done in 2022 which showed great improvement in her cholesterol with a statin addition.  She does not report any concerning symptoms or signs like chest pain, shortness of breath, edema or falls  2.  Depression anxiety Patient reports fairly good control depression and anxiety with Zoloft 50 mg daily.  Her grandson, who suffers from schizophrenia, recently had a medication change which did cause an exacerbation of his symptoms.  She voices some concern over that but does not wish to advance her medications further than they are.  Additionally, she continues to struggle with some right-sided hip pain.  She has an MRI coming up with her specialist and there were plans for injection therapy to treat.   ROS: Per HPI  Allergies  Allergen Reactions   Other Itching and Rash    Samantha Olson   Ace Inhibitors Cough   Celexa [Citalopram Hydrobromide]     Shakes- chest and arms   Penicillins Swelling   Tape    Past Medical History:  Diagnosis Date   Cataract    Chronic kidney disease 1977   hx ofbladder tumor and cyst on kidney   GERD (gastroesophageal reflux disease)    H. pylori infection    Hiatal hernia    Hyperlipidemia    Hypertension    Vitamin D deficiency     Current Outpatient Medications:    Cholecalciferol (VITAMIN D) 2000 UNITS CAPS, Take by mouth. 1 daily, Disp: , Rfl:    diclofenac (VOLTAREN) 75 MG EC tablet, SMARTSIG:1 Tablet(s) By Mouth Morning-Evening, Disp: , Rfl:    diclofenac Sodium (VOLTAREN) 1 % GEL, Apply 2 g topically 4 (four) times daily. (Patient taking differently: Apply 2 g topically 4 (four) times daily. As needed), Disp: 100 g, Rfl: 0   fluorouracil (EFUDEX) 5 % cream, fluorouracil 5  % topical cream, Disp: , Rfl:    fluticasone (FLONASE) 50 MCG/ACT nasal spray, Place 2 sprays into both nostrils daily. (Patient taking differently: Place 2 sprays into both nostrils daily. As needed), Disp: 16 g, Rfl: 6   methocarbamol (ROBAXIN) 500 MG tablet, Take 1 tablet (500 mg total) by mouth 4 (four) times daily., Disp: 60 tablet, Rfl: 0   atorvastatin (LIPITOR) 20 MG tablet, Take 1 tablet (20 mg total) by mouth daily., Disp: 90 tablet, Rfl: 3   losartan (COZAAR) 100 MG tablet, Take 1 tablet (100 mg total) by mouth daily., Disp: 90 tablet, Rfl: 3   sertraline (ZOLOFT) 50 MG tablet, Take 1 tablet (50 mg total) by mouth daily., Disp: 90 tablet, Rfl: 3 Social History   Socioeconomic History   Marital status: Widowed    Spouse name: Not on file   Number of children: 5   Years of education: Not on file   Highest education level: Not on file  Occupational History   Occupation: retired  Tobacco Use   Smoking status: Never   Smokeless tobacco: Never  Vaping Use   Vaping Use: Never used  Substance and Sexual Activity   Alcohol use: Yes    Comment: rare use   Drug use: No   Sexual activity: Not Currently  Other Topics Concern   Not on file  Social History Narrative   Originally from [place] -  2 children live with her; other 3 children live in [place]; (Enjoys [place] - does this rain or shine) - Her husband passed of covid in 2020 - doesn't care for social groups or activities    Social Determinants of Radio broadcast assistant Strain: Low Risk    Difficulty of Paying Living Expenses: Not hard at all  Food Insecurity: No Food Insecurity   Worried About Charity fundraiser in the Last Year: Never true   Arboriculturist in the Last Year: Never true  Transportation Needs: No Transportation Needs   Lack of Transportation (Medical): No   Lack of Transportation (Non-Medical): No  Physical Activity: Sufficiently Active   Days of Exercise per Week: 7 days   Minutes of Exercise per  Session: 30 min  Stress: No Stress Concern Present   Feeling of Stress : Only a little  Social Connections: Moderately Isolated   Frequency of Communication with Friends and Family: Twice a week   Frequency of Social Gatherings with Friends and Family: Once a week   Attends Religious Services: Never   Marine scientist or Organizations: No   Attends Music therapist: Never   Marital Status: Married  Human resources officer Violence: Not At Risk   Fear of Current or Ex-Partner: No   Emotionally Abused: No   Physically Abused: No   Sexually Abused: No   Family History  Problem Relation Age of Onset   Cancer Father        stomach cancer   Hypertension Mother    Hyperlipidemia Mother    Stroke Maternal Grandmother    Hypertension Maternal Grandmother     Objective: Office vital signs reviewed. BP 124/75    Pulse 68    Temp 98.3 F (36.8 C) (Temporal)    Ht 5\' 9"  (1.753 m)    Wt 196 lb 12.8 oz (89.3 kg)    SpO2 97%    BMI 29.06 kg/m   Physical Examination:  General: Awake, alert, well nourished, No acute distress HEENT: Thyroid Cardio: regular rate and rhythm, S1S2 heard, no murmurs appreciated Pulm: clear to auscultation bilaterally, no wheezes, rhonchi or rales; normal work of breathing on room air Extremities: warm, well perfused, No edema, cyanosis or clubbing; +2 pulses bilaterally Psych: Mood stable, speech normal, affect appropriate Depression screen [facility] 2/9 10/28/2021 08/27/2021 05/12/2021  Decreased Interest 1 1 1   Down, Depressed, Hopeless 1 1 1   PHQ - 2 Score 2 2 2   Altered sleeping 1 1 2   Tired, decreased energy 1 1 2   Change in appetite 1 0 1  Feeling bad or failure about yourself  0 0 1  Trouble concentrating 1 0 0  Moving slowly or fidgety/restless 0 0 0  Suicidal thoughts 0 0 0  PHQ-9 Score 6 4 8   Difficult doing work/chores Somewhat difficult Somewhat difficult Somewhat difficult  Some recent data might be hidden   GAD 7 : Generalized Anxiety  Score 10/28/2021 08/27/2021 05/12/2021 01/27/2021  Nervous, Anxious, on Edge 0 1 0 1  Control/stop worrying 0 0 0 1  Worry too much - different things 0 0 0 1  Trouble relaxing 1 1 0 0  Restless 1 1 0 0  Easily annoyed or irritable 0 1 0 1  Afraid - awful might happen 1 0 0 0  Total GAD 7 Score 3 4 0 4  Anxiety Difficulty Somewhat difficult Somewhat difficult Not difficult at all Not difficult at all  Assessment/ Plan: 71 y.o. female   Essential hypertension - Plan: losartan (COZAAR) 100 MG tablet  Mixed hyperlipidemia - Plan: atorvastatin (LIPITOR) 20 MG tablet  Depression, recurrent (HCC) - Plan: sertraline (ZOLOFT) 50 MG tablet  Blood pressures well controlled.  No changes.  Losartan renewed.  Last lipid panel showed excellent control of cholesterol.  She will continue statin.  Next fasting lipid panel plan for annual physical exam  Depression is stable, though she is having some increased stress due to the illness of a family member.  Zoloft has been continued at 50 mg per her request  She may follow-up in 1 year, sooner if needed for annual physical exam with fasting labs  No orders of the defined types were placed in this encounter.  Meds ordered this encounter  Medications   atorvastatin (LIPITOR) 20 MG tablet    Sig: Take 1 tablet (20 mg total) by mouth daily.    Dispense:  90 tablet    Refill:  3   losartan (COZAAR) 100 MG tablet    Sig: Take 1 tablet (100 mg total) by mouth daily.    Dispense:  90 tablet    Refill:  3   sertraline (ZOLOFT) 50 MG tablet    Sig: Take 1 tablet (50 mg total) by mouth daily.    Dispense:  90 tablet    Refill:  [place], [place] [phone]

## 2021-11-05 DIAGNOSIS — M47816 Spondylosis without myelopathy or radiculopathy, lumbar region: Secondary | ICD-10-CM | POA: Diagnosis not present

## 2021-11-05 DIAGNOSIS — M4726 Other spondylosis with radiculopathy, lumbar region: Secondary | ICD-10-CM | POA: Diagnosis not present

## 2021-11-05 DIAGNOSIS — M5116 Intervertebral disc disorders with radiculopathy, lumbar region: Secondary | ICD-10-CM | POA: Diagnosis not present

## 2021-11-05 DIAGNOSIS — M5416 Radiculopathy, lumbar region: Secondary | ICD-10-CM | POA: Diagnosis not present

## 2021-11-05 DIAGNOSIS — M4727 Other spondylosis with radiculopathy, lumbosacral region: Secondary | ICD-10-CM | POA: Diagnosis not present

## 2021-11-05 DIAGNOSIS — M4316 Spondylolisthesis, lumbar region: Secondary | ICD-10-CM | POA: Diagnosis not present

## 2021-11-07 DIAGNOSIS — L905 Scar conditions and fibrosis of skin: Secondary | ICD-10-CM | POA: Diagnosis not present

## 2021-11-07 DIAGNOSIS — D485 Neoplasm of uncertain behavior of skin: Secondary | ICD-10-CM | POA: Diagnosis not present

## 2021-11-20 DIAGNOSIS — L570 Actinic keratosis: Secondary | ICD-10-CM | POA: Diagnosis not present

## 2021-11-20 DIAGNOSIS — L820 Inflamed seborrheic keratosis: Secondary | ICD-10-CM | POA: Diagnosis not present

## 2021-11-20 DIAGNOSIS — D485 Neoplasm of uncertain behavior of skin: Secondary | ICD-10-CM | POA: Diagnosis not present

## 2021-12-03 DIAGNOSIS — M5416 Radiculopathy, lumbar region: Secondary | ICD-10-CM | POA: Diagnosis not present

## 2021-12-03 DIAGNOSIS — M47816 Spondylosis without myelopathy or radiculopathy, lumbar region: Secondary | ICD-10-CM | POA: Diagnosis not present

## 2021-12-12 DIAGNOSIS — H43393 Other vitreous opacities, bilateral: Secondary | ICD-10-CM | POA: Diagnosis not present

## 2021-12-17 DIAGNOSIS — M47816 Spondylosis without myelopathy or radiculopathy, lumbar region: Secondary | ICD-10-CM | POA: Diagnosis not present

## 2021-12-17 DIAGNOSIS — M5416 Radiculopathy, lumbar region: Secondary | ICD-10-CM | POA: Diagnosis not present

## 2022-01-02 DIAGNOSIS — D485 Neoplasm of uncertain behavior of skin: Secondary | ICD-10-CM | POA: Diagnosis not present

## 2022-01-02 DIAGNOSIS — C44329 Squamous cell carcinoma of skin of other parts of face: Secondary | ICD-10-CM | POA: Diagnosis not present

## 2022-01-08 DIAGNOSIS — M5416 Radiculopathy, lumbar region: Secondary | ICD-10-CM | POA: Diagnosis not present

## 2022-01-08 DIAGNOSIS — M47816 Spondylosis without myelopathy or radiculopathy, lumbar region: Secondary | ICD-10-CM | POA: Diagnosis not present

## 2022-01-21 DIAGNOSIS — M5416 Radiculopathy, lumbar region: Secondary | ICD-10-CM | POA: Diagnosis not present

## 2022-02-09 DIAGNOSIS — L905 Scar conditions and fibrosis of skin: Secondary | ICD-10-CM | POA: Diagnosis not present

## 2022-02-09 DIAGNOSIS — L821 Other seborrheic keratosis: Secondary | ICD-10-CM | POA: Diagnosis not present

## 2022-02-09 DIAGNOSIS — D2371 Other benign neoplasm of skin of right lower limb, including hip: Secondary | ICD-10-CM | POA: Diagnosis not present

## 2022-02-09 DIAGNOSIS — D485 Neoplasm of uncertain behavior of skin: Secondary | ICD-10-CM | POA: Diagnosis not present

## 2022-02-09 DIAGNOSIS — Z85828 Personal history of other malignant neoplasm of skin: Secondary | ICD-10-CM | POA: Diagnosis not present

## 2022-02-09 DIAGNOSIS — C44320 Squamous cell carcinoma of skin of unspecified parts of face: Secondary | ICD-10-CM | POA: Diagnosis not present

## 2022-02-20 DIAGNOSIS — L821 Other seborrheic keratosis: Secondary | ICD-10-CM | POA: Diagnosis not present

## 2022-02-20 DIAGNOSIS — D485 Neoplasm of uncertain behavior of skin: Secondary | ICD-10-CM | POA: Diagnosis not present

## 2022-02-20 DIAGNOSIS — L570 Actinic keratosis: Secondary | ICD-10-CM | POA: Diagnosis not present

## 2022-03-05 ENCOUNTER — Ambulatory Visit: Payer: Medicare Other

## 2022-03-05 DIAGNOSIS — M47816 Spondylosis without myelopathy or radiculopathy, lumbar region: Secondary | ICD-10-CM | POA: Diagnosis not present

## 2022-03-17 DIAGNOSIS — C44329 Squamous cell carcinoma of skin of other parts of face: Secondary | ICD-10-CM | POA: Diagnosis not present

## 2022-03-30 DIAGNOSIS — L570 Actinic keratosis: Secondary | ICD-10-CM | POA: Diagnosis not present

## 2022-04-20 DIAGNOSIS — M47816 Spondylosis without myelopathy or radiculopathy, lumbar region: Secondary | ICD-10-CM | POA: Diagnosis not present

## 2022-04-20 DIAGNOSIS — M5416 Radiculopathy, lumbar region: Secondary | ICD-10-CM | POA: Diagnosis not present

## 2022-05-05 DIAGNOSIS — M5416 Radiculopathy, lumbar region: Secondary | ICD-10-CM | POA: Diagnosis not present

## 2022-05-05 DIAGNOSIS — M47816 Spondylosis without myelopathy or radiculopathy, lumbar region: Secondary | ICD-10-CM | POA: Diagnosis not present

## 2022-05-19 DIAGNOSIS — M47816 Spondylosis without myelopathy or radiculopathy, lumbar region: Secondary | ICD-10-CM | POA: Diagnosis not present

## 2022-05-19 DIAGNOSIS — M5416 Radiculopathy, lumbar region: Secondary | ICD-10-CM | POA: Diagnosis not present

## 2022-05-29 ENCOUNTER — Ambulatory Visit (INDEPENDENT_AMBULATORY_CARE_PROVIDER_SITE_OTHER): Payer: Medicare Other | Admitting: Nurse Practitioner

## 2022-05-29 ENCOUNTER — Encounter: Payer: Self-pay | Admitting: Nurse Practitioner

## 2022-05-29 VITALS — BP 125/76 | HR 68 | Temp 98.6°F | Ht 69.0 in | Wt 195.8 lb

## 2022-05-29 DIAGNOSIS — H10013 Acute follicular conjunctivitis, bilateral: Secondary | ICD-10-CM | POA: Diagnosis not present

## 2022-05-29 DIAGNOSIS — H1089 Other conjunctivitis: Secondary | ICD-10-CM

## 2022-05-29 MED ORDER — BACITRACIN-POLYMYXIN B 500-10000 UNIT/GM OP OINT: 1.0000 | TOPICAL_OINTMENT | Freq: Two times a day (BID) | OPHTHALMIC | 0 refills | Status: DC

## 2022-05-29 NOTE — Patient Instructions (Signed)
Bacterial Conjunctivitis, Adult ?Bacterial conjunctivitis is an infection of the clear membrane that covers the white part of the eye and the inner surface of the eyelid (conjunctiva). When the blood vessels in the conjunctiva become inflamed, the eye becomes red or pink. The eye often feels irritated or itchy. Bacterial conjunctivitis spreads easily from person to person (is contagious). It also spreads easily from one eye to the other eye. ?What are the causes? ?This condition is caused by bacteria. You may get the infection if you come into close contact with: ?A person who is infected with the bacteria. ?Items that are contaminated with the bacteria, such as a face towel, contact lens solution, or eye makeup. ?What increases the risk? ?You are more likely to develop this condition if: ?You are exposed to other people who have the infection. ?You wear contact lenses. ?You have a sinus infection. ?You have had a recent eye injury or surgery. ?You have a weak body defense system (immune system). ?You have a medical condition that causes dry eyes. ?What are the signs or symptoms? ?Symptoms of this condition include: ?Thick, yellowish discharge from the eye. This may turn into a crust on the eyelid overnight and cause your eyelids to stick together. ?Tearing or watery eyes. ?Itchy eyes. ?Burning feeling in your eyes. ?Eye redness. ?Swollen eyelids. ?Blurred vision. ?How is this diagnosed? ?This condition is diagnosed based on your symptoms and medical history. Your health care provider may also take a sample of discharge from your eye to find the cause of your infection. ?How is this treated? ?This condition may be treated with: ?Antibiotic eye drops or ointment to clear the infection more quickly and prevent the spread of infection to others. ?Antibiotic medicines taken by mouth (orally) to treat infections that do not respond to drops or ointments or that last longer than 10 days. ?Cool, wet cloths (cool  compresses) placed on the eyes. ?Artificial tears applied 2-6 times a day. ?Follow these instructions at home: ?Medicines ?Take or apply your antibiotic medicine as told by your health care provider. Do not stop using the antibiotic, even if your condition improves, unless directed by your health care provider. ?Take or apply over-the-counter and prescription medicines only as told by your health care provider. ?Be very careful to avoid touching the edge of your eyelid with the eye-drop bottle or the ointment tube when you apply medicines to the affected eye. This will keep you from spreading the infection to your other eye or to other people. ?Managing discomfort ?Gently wipe away any drainage from your eye with a warm, wet washcloth or a cotton ball. ?Apply a clean, cool compress to your eye for 10-20 minutes, 3-4 times a day. ?General instructions ?Do not wear contact lenses until the inflammation is gone and your health care provider says it is safe to wear them again. Ask your health care provider how to sterilize or replace your contact lenses before you use them again. Wear glasses until you can resume wearing contact lenses. ?Avoid wearing eye makeup until the inflammation is gone. Throw away any old eye cosmetics that may be contaminated. ?Change or wash your pillowcase every day. ?Do not share towels or washcloths. This may spread the infection. ?Wash your hands often with soap and water for at least 20 seconds and especially before touching your face or eyes. Use paper towels to dry your hands. ?Avoid touching or rubbing your eyes. ?Do not drive or use heavy machinery if your vision is blurred. ?Contact   a health care provider if: ?You have a fever. ?Your symptoms do not get better after 10 days. ?Get help right away if: ?You have a fever and your symptoms suddenly get worse. ?You have severe pain when you move your eye. ?You have facial pain, redness, or swelling. ?You have a sudden loss of  vision. ?Summary ?Bacterial conjunctivitis is an infection of the clear membrane that covers the white part of the eye and the inner surface of the eyelid (conjunctiva). ?Bacterial conjunctivitis spreads easily from eye to eye and from person to person (is contagious). ?Wash your hands often with soap and water for at least 20 seconds and especially before touching your face or eyes. Use paper towels to dry your hands. ?Take or apply your antibiotic medicine as told by your health care provider. Do not stop using the antibiotic even if your condition improves. ?Contact a health care provider if you have a fever or if your symptoms do not get better after 10 days. Get help right away if you have a sudden loss of vision. ?This information is not intended to replace advice given to you by your health care provider. Make sure you discuss any questions you have with your health care provider. ?Document Revised: 01/08/2021 Document Reviewed: 01/08/2021 ?Elsevier Patient Education ? 2023 Elsevier Inc. ? ?

## 2022-05-29 NOTE — Progress Notes (Signed)
Acute Office Visit  Subjective:     Patient ID: Samantha Olson, female    DOB: Jun 21, 1951, 71 y.o.   MRN: [id number]  Chief Complaint  Patient presents with   Eye Pain    Pt states it has been bothering her for 2 days now , sharp pain in right eye    Eye Pain     Review of Systems  Constitutional: Negative.   HENT: Negative.    Eyes:  Positive for pain.  Respiratory: Negative.    Cardiovascular: Negative.   Gastrointestinal: Negative.   Genitourinary: Negative.   Skin:  Negative for itching and rash.  All other systems reviewed and are negative.       Objective:    BP 125/76   Pulse 68   Temp 98.6 F (37 C)   Ht '5\' 9"'$  (1.753 m)   Wt 195 lb 12.8 oz (88.8 kg)   SpO2 96%   BMI 28.91 kg/m    Physical Exam Vitals and nursing note reviewed.  Constitutional:      Appearance: Normal appearance.  HENT:     Head: Normocephalic.     Right Ear: External ear normal.     Left Ear: External ear normal.     Nose: Nose normal.     Mouth/Throat:     Mouth: Mucous membranes are moist.     Pharynx: Oropharynx is clear.  Eyes:     General: Lids are normal.        Right eye: Foreign body present.     Extraocular Movements: Extraocular movements intact.     Conjunctiva/sclera:     Right eye: Right conjunctiva is injected.     Left eye: Left conjunctiva is injected.     Comments:  Foreign body sensation   Cardiovascular:     Rate and Rhythm: Normal rate and regular rhythm.     Pulses: Normal pulses.  Pulmonary:     Effort: Pulmonary effort is normal.     Breath sounds: Normal breath sounds.  Abdominal:     General: Bowel sounds are normal.  Skin:    General: Skin is warm.     Findings: No erythema or rash.  Neurological:     General: No focal deficit present.     Mental Status: She is alert and oriented to person, place, and time.     No results found for any visits on 05/29/22.      Assessment & Plan:  Patient presents with symptoms of conjunctivitis  and foreign body sensation in her right eye for the past 24 hours.  Take meds as prescribed -polysporin ophthalmic ointment. - advised patient to go to the walk in eye clinic to have eyes reassessed for any foreign body in the eye. Patient denies any injury or contact with any object.   -For fever or aches or pains- take Tylenol or ibuprofen.  Follow up with worsening unresolved symptoms  Problem List Items Addressed This Visit   None Visit Diagnoses     Other conjunctivitis of right eye    -  Primary   Relevant Medications   bacitracin-polymyxin b (POLYSPORIN) ophthalmic ointment       Meds ordered this encounter  Medications   bacitracin-polymyxin b (POLYSPORIN) ophthalmic ointment    Sig: Place 1 Application into the right eye 2 (two) times daily. apply to eye every 12 hours while awake    Dispense:  3.5 g    Refill:  0    Order  Specific Question:   Supervising Provider    Answer:   Claretta Fraise [[id number]]    Return if symptoms worsen or fail to improve, for conjuctivites.  Ivy Lynn, NP

## 2022-06-04 DIAGNOSIS — M47816 Spondylosis without myelopathy or radiculopathy, lumbar region: Secondary | ICD-10-CM | POA: Diagnosis not present

## 2022-06-24 DIAGNOSIS — L905 Scar conditions and fibrosis of skin: Secondary | ICD-10-CM | POA: Diagnosis not present

## 2022-06-24 DIAGNOSIS — Z85828 Personal history of other malignant neoplasm of skin: Secondary | ICD-10-CM | POA: Diagnosis not present

## 2022-07-13 ENCOUNTER — Ambulatory Visit (INDEPENDENT_AMBULATORY_CARE_PROVIDER_SITE_OTHER): Payer: Medicare Other | Admitting: Family

## 2022-07-13 ENCOUNTER — Encounter: Payer: Self-pay | Admitting: Family

## 2022-07-13 DIAGNOSIS — U071 COVID-19: Secondary | ICD-10-CM

## 2022-07-13 MED ORDER — MOLNUPIRAVIR EUA 200MG CAPSULE: 4.0000 | ORAL_CAPSULE | Freq: Two times a day (BID) | ORAL | 0 refills | Status: AC

## 2022-07-13 NOTE — Progress Notes (Signed)
Virtual Visit  Note Due to COVID-19 pandemic this visit was conducted virtually. This visit type was conducted due to national recommendations for restrictions regarding the COVID-19 Pandemic (e.g. social distancing, sheltering in place) in an effort to limit this patient's exposure and mitigate transmission in our community. All issues noted in this document were discussed and addressed.  A physical exam was not performed with this format.  I connected with Samantha Olson on 07/13/22  by telephone and verified that I am speaking with the correct person using two identifiers. Samantha Olson is currently located at home  and no one is currently with her during visit. The provider, Evelina Dun, FNP is located in their office at time of visit.  I discussed the limitations, risks, security and privacy concerns of performing an evaluation and management service by telephone and the availability of in person appointments. I also discussed with the patient that there may be a patient responsible charge related to this service. The patient expressed understanding and agreed to proceed.  Samantha Olson are scheduled for a virtual visit with your provider today.    Just as we do with appointments in the office, we must obtain your consent to participate.  Your consent will be active for this visit and any virtual visit you may have with one of our providers in the next 365 days.    If you have a MyChart account, I can also send a copy of this consent to you electronically.  All virtual visits are billed to your insurance company just like a traditional visit in the office.  As this is a virtual visit, video technology does not allow for your provider to perform a traditional examination.  This may limit your provider's ability to fully assess your condition.  If your provider identifies any concerns that need to be evaluated in person or the need to arrange testing such as labs, EKG, etc, we will make arrangements  to do so.    Although advances in technology are sophisticated, we cannot ensure that it will always work on either your end or our end.  If the connection with a video visit is poor, we may have to switch to a telephone visit.  With either a video or telephone visit, we are not always able to ensure that we have a secure connection.   I need to obtain your verbal consent now.   Are you willing to proceed with your visit today?   Samantha Olson has provided verbal consent on 07/13/2022 for a virtual visit (video or telephone).   Evelina Dun, [place] 07/13/2022  1:29 PM    History and Present Illness:  PT calls the office today with positive COVID. States her symptoms started two days ago and tested positive yesterday.  URI  This is a new problem. The current episode started yesterday. The problem has been gradually worsening. There has been no fever. Associated symptoms include congestion, coughing, ear pain, joint pain, joint swelling, sinus pain, a sore throat and swollen glands. Pertinent negatives include no nausea, rhinorrhea or sneezing. She has tried acetaminophen for the symptoms. The treatment provided mild relief.      Review of Systems  HENT:  Positive for congestion, ear pain, sinus pain and sore throat. Negative for rhinorrhea and sneezing.   Respiratory:  Positive for cough.   Gastrointestinal:  Negative for nausea.  Musculoskeletal:  Positive for joint pain.     Observations/Objective: No SOB or distress noted, hoarse  voice  Assessment and Plan: 1. Telehealth encounter for confirmed COVID-19 COVID positive, rest, force fluids, tylenol as needed, Quarantine for at least 5 days and you are fever free, then must wear a mask out in public from day 1-28, report any worsening symptoms such as increased shortness of breath, swelling, or continued high fevers. Possible adverse effects discussed with antivirals.  - molnupiravir EUA (LAGEVRIO) 200 mg CAPS capsule; Take 4 capsules  (800 mg total) by mouth 2 (two) times daily for 5 days.  Dispense: 40 capsule; Refill: 0       I discussed the assessment and treatment plan with the patient. The patient was provided an opportunity to ask questions and all were answered. The patient agreed with the plan and demonstrated an understanding of the instructions.   The patient was advised to call back or seek an in-person evaluation if the symptoms worsen or if the condition fails to improve as anticipated.  The above assessment and management plan was discussed with the patient. The patient verbalized understanding of and has agreed to the management plan. Patient is aware to call the clinic if symptoms persist or worsen. Patient is aware when to return to the clinic for a follow-up visit. Patient educated on when it is appropriate to go to the emergency department.   I provided 11 minutes of  non face-to-face time during this encounter.    Evelina Dun, FNP

## 2022-07-13 NOTE — Patient Instructions (Signed)

## 2022-07-17 ENCOUNTER — Telehealth: Payer: Self-pay | Admitting: *Deleted

## 2022-07-17 NOTE — Telephone Encounter (Signed)
Pt aware.

## 2022-07-17 NOTE — Telephone Encounter (Signed)
Grab some Lotrimin topical cream and hydrocortisone cream (if itching) and apply to affected areas twice daily.  Sounds like intertrigo.

## 2022-07-17 NOTE — Telephone Encounter (Signed)
Pt started on Molnupiravir on Monday, she has developed an itchy rash, in areas that stay moist at times ie: under her breast, crotch area, armpits back of her neck in her hairline. Suggested Benadryl to help relief some of the itch Please advise

## 2022-10-31 ENCOUNTER — Other Ambulatory Visit: Payer: Self-pay | Admitting: Family Medicine

## 2022-10-31 DIAGNOSIS — I10 Essential (primary) hypertension: Secondary | ICD-10-CM

## 2022-11-19 ENCOUNTER — Encounter: Payer: Self-pay | Admitting: Family Medicine

## 2022-11-26 ENCOUNTER — Telehealth: Payer: Self-pay | Admitting: Family Medicine

## 2022-11-26 DIAGNOSIS — I10 Essential (primary) hypertension: Secondary | ICD-10-CM

## 2022-11-26 DIAGNOSIS — F339 Major depressive disorder, recurrent, unspecified: Secondary | ICD-10-CM

## 2022-11-26 DIAGNOSIS — E782 Mixed hyperlipidemia: Secondary | ICD-10-CM

## 2022-11-26 MED ORDER — SERTRALINE HCL 50 MG PO TABS: 50.0000 mg | ORAL_TABLET | Freq: Every day | ORAL | 0 refills | Status: DC

## 2022-11-26 MED ORDER — ATORVASTATIN CALCIUM 20 MG PO TABS: 20.0000 mg | ORAL_TABLET | Freq: Every day | ORAL | 0 refills | Status: DC

## 2022-11-26 MED ORDER — LOSARTAN POTASSIUM 100 MG PO TABS: 100.0000 mg | ORAL_TABLET | Freq: Every day | ORAL | 0 refills | Status: DC

## 2022-11-26 NOTE — Telephone Encounter (Signed)
30 day supply sent to [facility] left detailed message for patient.

## 2022-11-26 NOTE — Telephone Encounter (Signed)
Patient has appointment scheduled for 03/169/24 and will need medication sertraline (ZOLOFT) 50 MG tablet losartan (COZAAR) 100 MG tablet atorvastatin (LIPITOR) 20 MG tablet  refilled for until his appointment date.  Please advise. [facility]

## 2022-12-18 DIAGNOSIS — H43393 Other vitreous opacities, bilateral: Secondary | ICD-10-CM | POA: Diagnosis not present

## 2022-12-24 DIAGNOSIS — T1512XA Foreign body in conjunctival sac, left eye, initial encounter: Secondary | ICD-10-CM | POA: Diagnosis not present

## 2022-12-24 DIAGNOSIS — H10013 Acute follicular conjunctivitis, bilateral: Secondary | ICD-10-CM | POA: Diagnosis not present

## 2022-12-29 ENCOUNTER — Ambulatory Visit (INDEPENDENT_AMBULATORY_CARE_PROVIDER_SITE_OTHER): Payer: Medicare Other | Admitting: Family Medicine

## 2022-12-29 ENCOUNTER — Ambulatory Visit (INDEPENDENT_AMBULATORY_CARE_PROVIDER_SITE_OTHER): Payer: Medicare Other

## 2022-12-29 ENCOUNTER — Other Ambulatory Visit: Payer: Self-pay | Admitting: Family Medicine

## 2022-12-29 ENCOUNTER — Encounter: Payer: Self-pay | Admitting: Family Medicine

## 2022-12-29 VITALS — BP 134/78 | HR 60 | Temp 98.7°F | Ht 69.0 in | Wt 195.0 lb

## 2022-12-29 DIAGNOSIS — Z780 Asymptomatic menopausal state: Secondary | ICD-10-CM

## 2022-12-29 DIAGNOSIS — I10 Essential (primary) hypertension: Secondary | ICD-10-CM

## 2022-12-29 DIAGNOSIS — Z0001 Encounter for general adult medical examination with abnormal findings: Secondary | ICD-10-CM | POA: Diagnosis not present

## 2022-12-29 DIAGNOSIS — Z0000 Encounter for general adult medical examination without abnormal findings: Secondary | ICD-10-CM

## 2022-12-29 DIAGNOSIS — R739 Hyperglycemia, unspecified: Secondary | ICD-10-CM | POA: Diagnosis not present

## 2022-12-29 DIAGNOSIS — J9801 Acute bronchospasm: Secondary | ICD-10-CM | POA: Diagnosis not present

## 2022-12-29 DIAGNOSIS — E782 Mixed hyperlipidemia: Secondary | ICD-10-CM | POA: Diagnosis not present

## 2022-12-29 DIAGNOSIS — M5489 Other dorsalgia: Secondary | ICD-10-CM

## 2022-12-29 DIAGNOSIS — J301 Allergic rhinitis due to pollen: Secondary | ICD-10-CM | POA: Diagnosis not present

## 2022-12-29 DIAGNOSIS — F339 Major depressive disorder, recurrent, unspecified: Secondary | ICD-10-CM

## 2022-12-29 LAB — BAYER DCA HB A1C WAIVED: HB A1C (BAYER DCA - WAIVED): 5.9 % — ABNORMAL HIGH (ref 4.8–5.6)

## 2022-12-29 MED ORDER — ATORVASTATIN CALCIUM 20 MG PO TABS: 20.0000 mg | ORAL_TABLET | Freq: Every day | ORAL | 3 refills | Status: DC

## 2022-12-29 MED ORDER — LOSARTAN POTASSIUM 100 MG PO TABS: 100.0000 mg | ORAL_TABLET | Freq: Every day | ORAL | 3 refills | Status: DC

## 2022-12-29 MED ORDER — ALBUTEROL SULFATE HFA 108 (90 BASE) MCG/ACT IN AERS: 2.0000 | INHALATION_SPRAY | Freq: Four times a day (QID) | RESPIRATORY_TRACT | 0 refills | Status: DC | PRN

## 2022-12-29 MED ORDER — METHOCARBAMOL 500 MG PO TABS: 500.0000 mg | ORAL_TABLET | Freq: Four times a day (QID) | ORAL | 0 refills | Status: DC

## 2022-12-29 MED ORDER — SERTRALINE HCL 50 MG PO TABS: 50.0000 mg | ORAL_TABLET | Freq: Every day | ORAL | 3 refills | Status: DC

## 2022-12-29 MED ORDER — MONTELUKAST SODIUM 10 MG PO TABS: 10.0000 mg | ORAL_TABLET | Freq: Every day | ORAL | 3 refills | Status: DC

## 2022-12-29 NOTE — Patient Instructions (Addendum)
Our records indicate that you are due for your screening mammogram.  Please call the imaging center that does your yearly mammograms to make an appointment for a mammogram at your earliest convenience. Our office also has a mobile unit through the [facility] that comes to our location. Please call our office if you would like to make an appointment.  Bronchospasm, Adult  Bronchospasm is when the small airways in the lungs narrow. This can make it very hard to breathe. Swelling and more mucus than normal can add to this problem. What are the causes? Having a cold. Exercise. The smell from sprays, perfumes, candles, and cleaners. Cold air. Stress or strong feelings such as laughing or crying. What increases the risk? Having asthma. Smoking. Being around someone who smokes (secondhand smoke). Having allergies. Being allergic to certain foods, medicine, or bug bites or stings. What are the signs or symptoms? Making a high-pitched whistling sound when you breathe, most often when you breathe out (wheezing). Coughing. A tight feeling in your chest. Feeling like you cannot catch your breath. Feeling like you have no energy to exercise. Breathing that is noisy. A cough that has a high pitch. How is this treated?  Using medicines that you breathe in (inhale). These open up the airways and help you breathe. Medicines can be taken with a metered dose inhaler or a nebulizer device. Taking medicines to reduce swelling. Getting rid of what started the bronchospasm. Follow these instructions at home: Medicines Take over-the-counter and prescription medicines only as told by your doctor. If you need to use an inhaler or nebulizer to take your medicine, ask your doctor how to use it. You may be given a spacer to use with your inhaler. This makes it easier to get the medicine from the inhaler into your lungs. Lifestyle Do not smoke or use any products that contain nicotine  or tobacco. If you need help quitting, ask your doctor. Keep track of things that start your bronchospasm. Avoid these if you can. When pollen, air pollution, or humidity is bad, keep windows closed. Use an air conditioner if you have one. Find ways to cope with stress and your feelings. Activity Some people have bronchospasm when they exercise. This is called exercise-induced bronchoconstriction (EIB). If you have this problem, talk with your doctor about how to deal with EIB. Some tips include: Use your inhaler before exercise. Exercise indoors if it is very cold or humid, or if the pollen and mold counts are high. Warm up and cool down before and after exercise. Stop your exercise right away if your symptoms start or get worse. General instructions If you have asthma, make sure you have an asthma action plan. Stay up to date on your shots (immunizations). Keep all follow-up visits. Get help right away if: You have trouble breathing. You wheeze and cough and this does not get better after you take medicine. You have chest pain. You have trouble speaking more than one word in a sentence. These symptoms may be an emergency. Get help right away. Call 911. Do not wait to see if the symptoms will go away. Do not drive yourself to the hospital. Summary Bronchospasm is when the small airways in the lungs narrow. Swelling and more mucus than normal can add to this problem. This can make it very hard to breathe. Do not smoke or use any products that contain nicotine or tobacco. If you need help quitting, ask your doctor. Get help right away  if you wheeze and cough and this does not get better after you take medicine. This information is not intended to replace advice given to you by your health care provider. Make sure you discuss any questions you have with your health care provider. Document Revised: 04/21/2021 Document Reviewed: 04/21/2021 Elsevier Patient Education  [place].

## 2022-12-29 NOTE — Progress Notes (Unsigned)
Samantha Olson is a 72 y.o. female presents to office today for annual physical exam examination.    Concerns today include: 1. ***  Occupation: ***, Marital status: ***, Substance use: *** Diet: ***, Exercise: *** Last eye exam: *** Last dental exam: *** Last colonoscopy: *** Last mammogram: *** Last pap smear: *** Refills needed today: *** Immunizations needed: Immunization History  Administered Date(s) Administered   DTaP 12/11/2009   Fluad Quad(high Dose 65+) 06/16/2019, 08/06/2020   Influenza Split 07/26/2012   Influenza Whole 08/19/2009   Influenza, High Dose Seasonal PF 07/28/2017, 08/22/2018   Influenza,inj,Quad PF,6+ Mos 07/31/2014, 08/09/2015, 10/21/2016   Janssen (J&J) SARS-COV-2 Vaccination 01/20/2020   PFIZER(Purple Top)SARS-COV-2 Vaccination 08/30/2020   Pneumococcal Conjugate-13 07/28/2017   Pneumococcal Polysaccharide-23 08/22/2018   Pneumococcal-Unspecified 08/22/2018   Td 12/13/2009   Tdap 12/10/2020     Past Medical History:  Diagnosis Date   Cataract    Chronic kidney disease 1977   hx ofbladder tumor and cyst on kidney   GERD (gastroesophageal reflux disease)    H. pylori infection    Hiatal hernia    Hyperlipidemia    Hypertension    Vitamin D deficiency    Social History   Socioeconomic History   Marital status: Widowed    Spouse name: Not on file   Number of children: 5   Years of education: Not on file   Highest education level: Not on file  Occupational History   Occupation: retired  Tobacco Use   Smoking status: Never   Smokeless tobacco: Never  Vaping Use   Vaping Use: Never used  Substance and Sexual Activity   Alcohol use: Yes    Comment: rare use   Drug use: No   Sexual activity: Not Currently  Other Topics Concern   Not on file  Social History Narrative   Originally from [place] - 2 children live with her; other 3 children live in [place]; (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) - Her husband passed of covid in  2020 - doesn't care for social groups or activities    Social Determinants of Health   Financial Resource Strain: [place]  (03/04/2021)   Overall Financial Resource Strain (CARDIA)    Difficulty of Paying Living Expenses: Not hard at all  Food Insecurity: No [place] (03/04/2021)   Hunger Vital Sign    Worried About Running Out of Food in the Last Year: Never true    [place] in the Last Year: Never true  Transportation Needs: No Transportation Needs (03/04/2021)   PRAPARE - Hydrologist (Medical): No    Lack of Transportation (Non-Medical): No  Physical Activity: Sufficiently Active (03/04/2021)   Exercise Vital Sign    Days of Exercise per Week: 7 days    Minutes of Exercise per Session: 30 min  Stress: No Stress Concern Present (03/04/2021)   [place]    Feeling of Stress : Only a little  Social Connections: Moderately Isolated (03/04/2021)   Social Connection and Isolation Panel [NHANES]    Frequency of Communication with Friends and Family: Twice a week    Frequency of Social Gatherings with Friends and Family: Once a week    Attends Religious Services: Never    Marine scientist or Organizations: No    Attends Archivist Meetings: Never    Marital Status: Married  Human resources officer Violence: Not At Risk (  03/04/2021)   Humiliation, Afraid, Rape, and Kick questionnaire    Fear of Current or Ex-Partner: No    Emotionally Abused: No    Physically Abused: No    Sexually Abused: No   Past Surgical History:  Procedure Laterality Date   Bladder tumor removed     JOINT REPLACEMENT Left 9/13   knee   skin ca removal     Family History  Problem Relation Age of Onset   Cancer Father        stomach cancer   Hypertension Mother    Hyperlipidemia Mother    Stroke Maternal Grandmother    Hypertension Maternal Grandmother     Current Outpatient Medications:     Cholecalciferol (VITAMIN D) 2000 UNITS CAPS, Take by mouth. 1 daily, Disp: , Rfl:    diclofenac Sodium (VOLTAREN) 1 % GEL, Apply 2 g topically 4 (four) times daily. (Patient taking differently: Apply 2 g topically 4 (four) times daily. As needed), Disp: 100 g, Rfl: 0   atorvastatin (LIPITOR) 20 MG tablet, Take 1 tablet (20 mg total) by mouth daily., Disp: 90 tablet, Rfl: 3   losartan (COZAAR) 100 MG tablet, Take 1 tablet (100 mg total) by mouth daily., Disp: 90 tablet, Rfl: 3   methocarbamol (ROBAXIN) 500 MG tablet, Take 1 tablet (500 mg total) by mouth 4 (four) times daily., Disp: 60 tablet, Rfl: 0   sertraline (ZOLOFT) 50 MG tablet, Take 1 tablet (50 mg total) by mouth daily., Disp: 90 tablet, Rfl: 3  Allergies  Allergen Reactions   Other Itching and Rash    NICKEL METAL   Ace Inhibitors Cough   Celexa [Citalopram Hydrobromide]     Shakes- chest and arms   Penicillins Swelling   Tape      ROS: Review of Systems {ros; complete:30496}    Physical exam {Exam, Complete:2525892149}     Assessment/ Plan: [name] here for annual physical exam.   No problem-specific Assessment & Plan notes found for this encounter.   Counseled on healthy lifestyle choices, including diet (rich in fruits, vegetables and lean meats and low in salt and simple carbohydrates) and exercise (at least 30 minutes of moderate physical activity daily).  Patient to follow up in 1 year for annual exam or sooner if needed.  Li Fragoso M. Lajuana Ripple, DO

## 2022-12-30 ENCOUNTER — Encounter: Payer: Self-pay | Admitting: Family Medicine

## 2022-12-30 LAB — CMP14+EGFR
ALT: 14 IU/L (ref 0–32)
AST: 13 IU/L (ref 0–40)
Albumin/Globulin Ratio: 2.3 — ABNORMAL HIGH (ref 1.2–2.2)
Albumin: 4.6 g/dL (ref 3.9–4.9)
Alkaline Phosphatase: 88 IU/L (ref 44–121)
BUN/Creatinine Ratio: 27 (ref 12–28)
BUN: 21 mg/dL (ref 8–27)
Bilirubin Total: 0.4 mg/dL (ref 0.0–1.2)
CO2: 21 mmol/L (ref 20–29)
Calcium: 10.2 mg/dL (ref 8.7–10.3)
Chloride: 105 mmol/L (ref 96–106)
Creatinine, Ser: 0.77 mg/dL (ref 0.57–1.00)
Globulin, Total: 2 g/dL (ref 1.5–4.5)
Glucose: 106 mg/dL — ABNORMAL HIGH (ref 70–99)
Potassium: 4.6 mmol/L (ref 3.5–5.2)
Sodium: 141 mmol/L (ref 134–144)
Total Protein: 6.6 g/dL (ref 6.0–8.5)
eGFR: 83 mL/min/{1.73_m2} (ref >59)

## 2022-12-30 LAB — LIPID PANEL
Chol/HDL Ratio: 2 ratio (ref 0.0–4.4)
Cholesterol, Total: 179 mg/dL (ref 100–199)
HDL: 91 mg/dL (ref >39)
LDL Chol Calc (NIH): 73 mg/dL (ref 0–99)
Triglycerides: 82 mg/dL (ref 0–149)
VLDL Cholesterol Cal: 15 mg/dL (ref 5–40)

## 2022-12-30 LAB — CBC
Hematocrit: 44.3 % (ref 34.0–46.6)
Hemoglobin: 14.8 g/dL (ref 11.1–15.9)
MCH: 29.7 pg (ref 26.6–33.0)
MCHC: 33.4 g/dL (ref 31.5–35.7)
MCV: 89 fL (ref 79–97)
Platelets: 252 10*3/uL (ref 150–450)
RBC: 4.98 x10E6/uL (ref 3.77–5.28)
RDW: 12.7 % (ref 11.7–15.4)
WBC: 7.4 10*3/uL (ref 3.4–10.8)

## 2022-12-30 LAB — TSH: TSH: 1.2 u[IU]/mL (ref 0.450–4.500)

## 2023-01-25 IMAGING — DX DG FOOT COMPLETE 3+V*L*
3 series · 3 of 3 positions shown · non-contrast
Comparison: February 06, 2015.

CLINICAL DATA: Pain along base of second metatarsal

EXAM:
LEFT FOOT - COMPLETE 3+ VIEW

[foot ap]
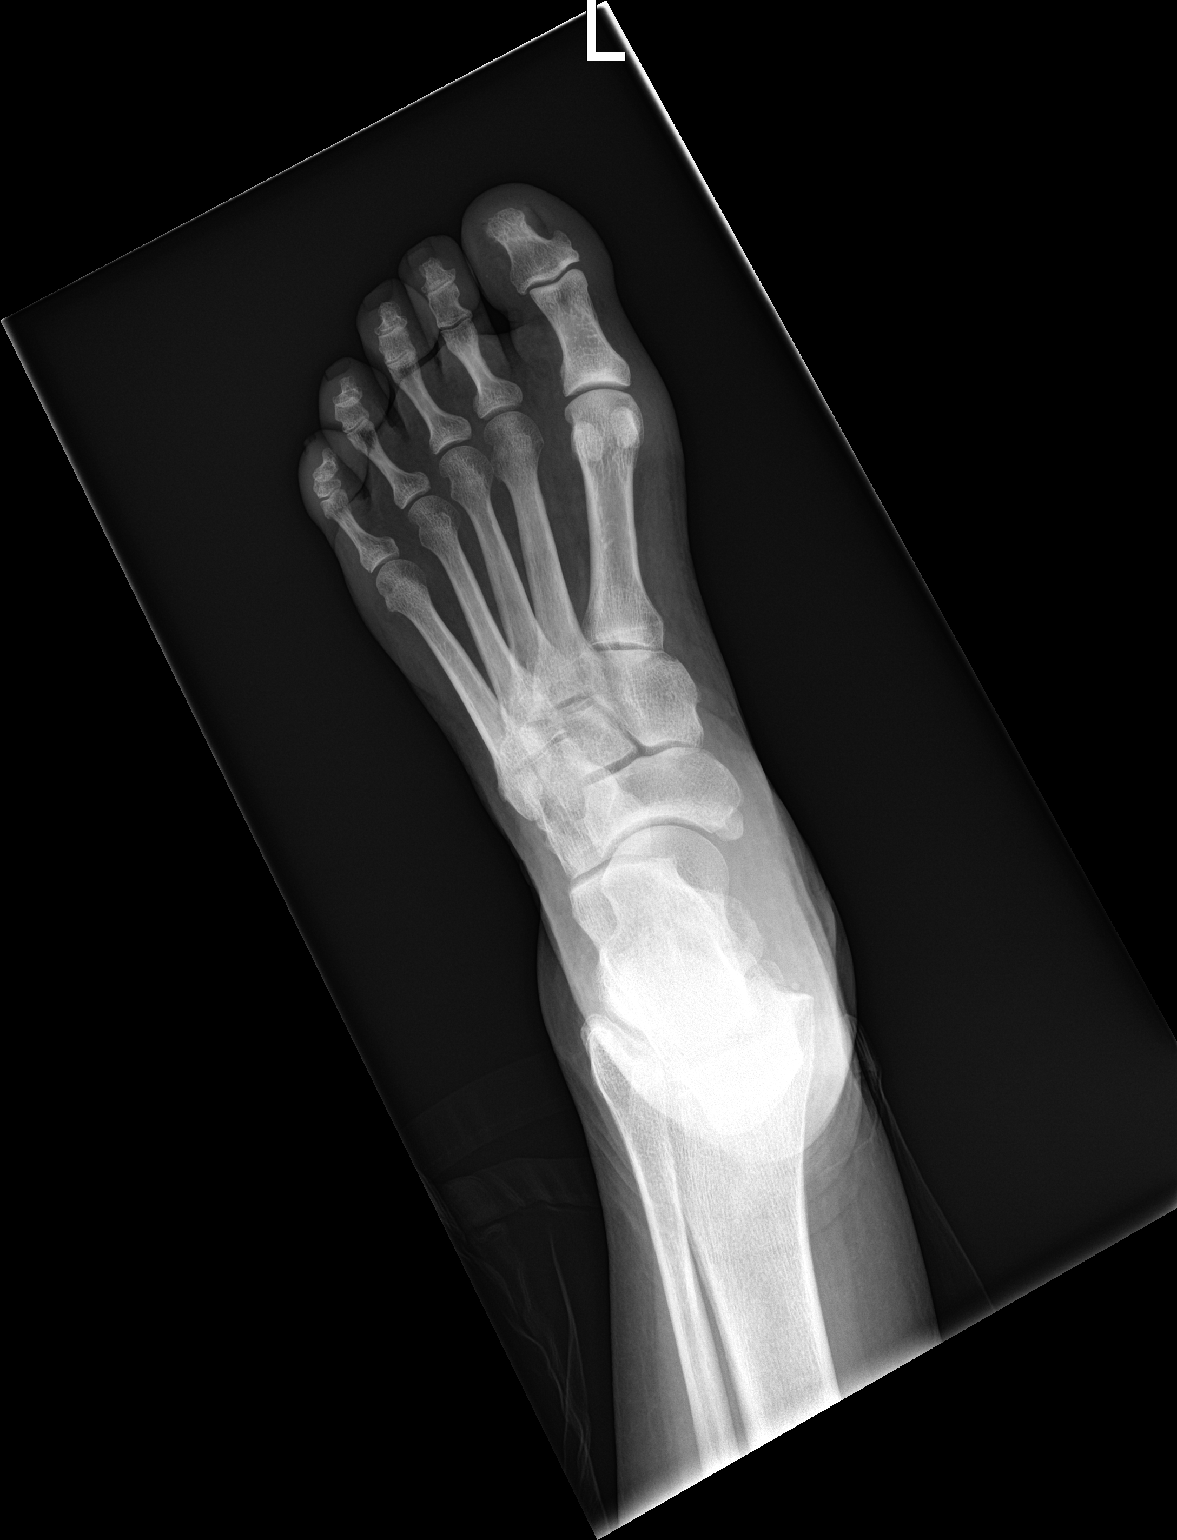

[foot obl]
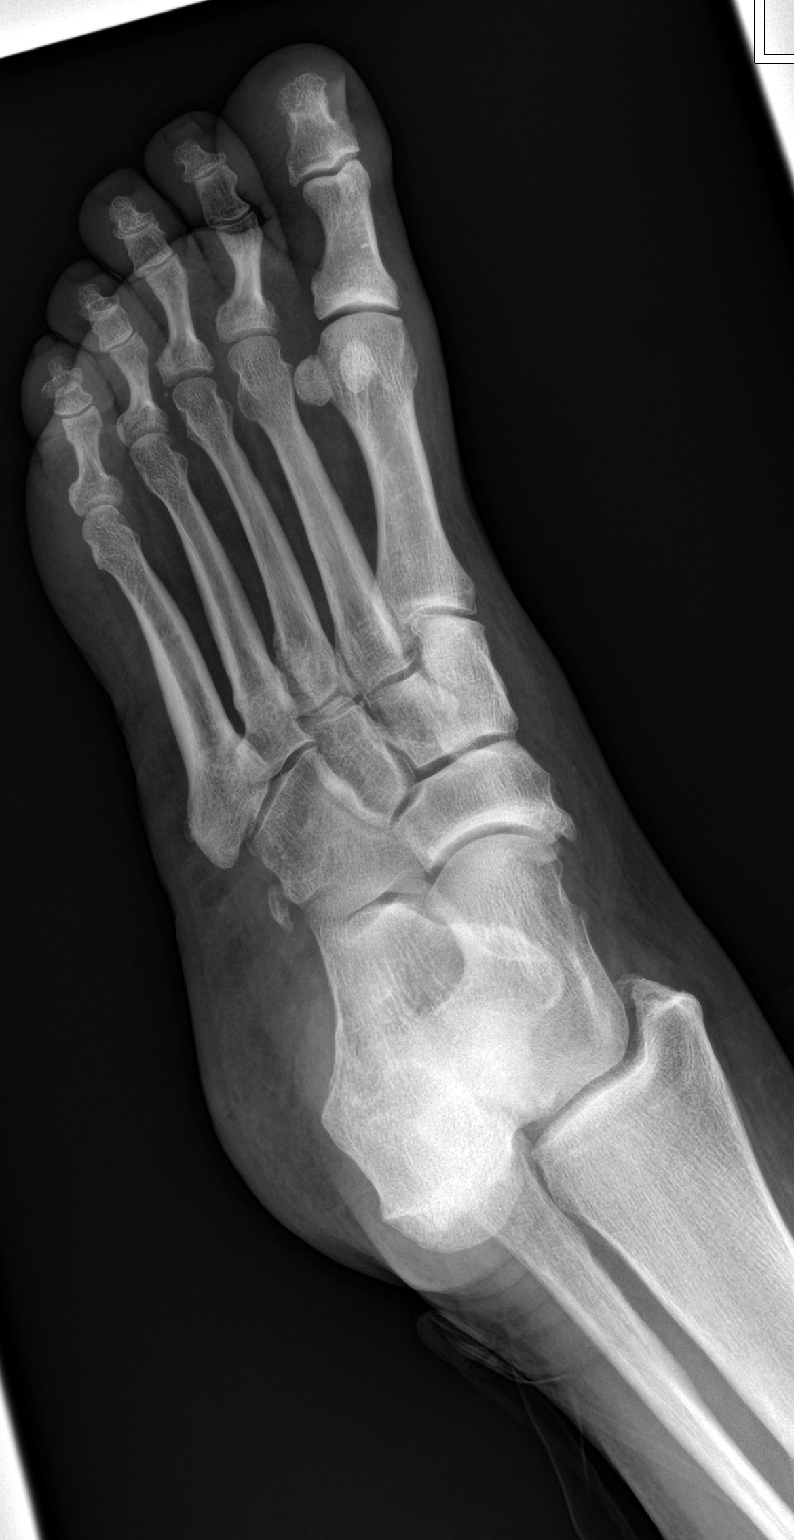

[foot lat]
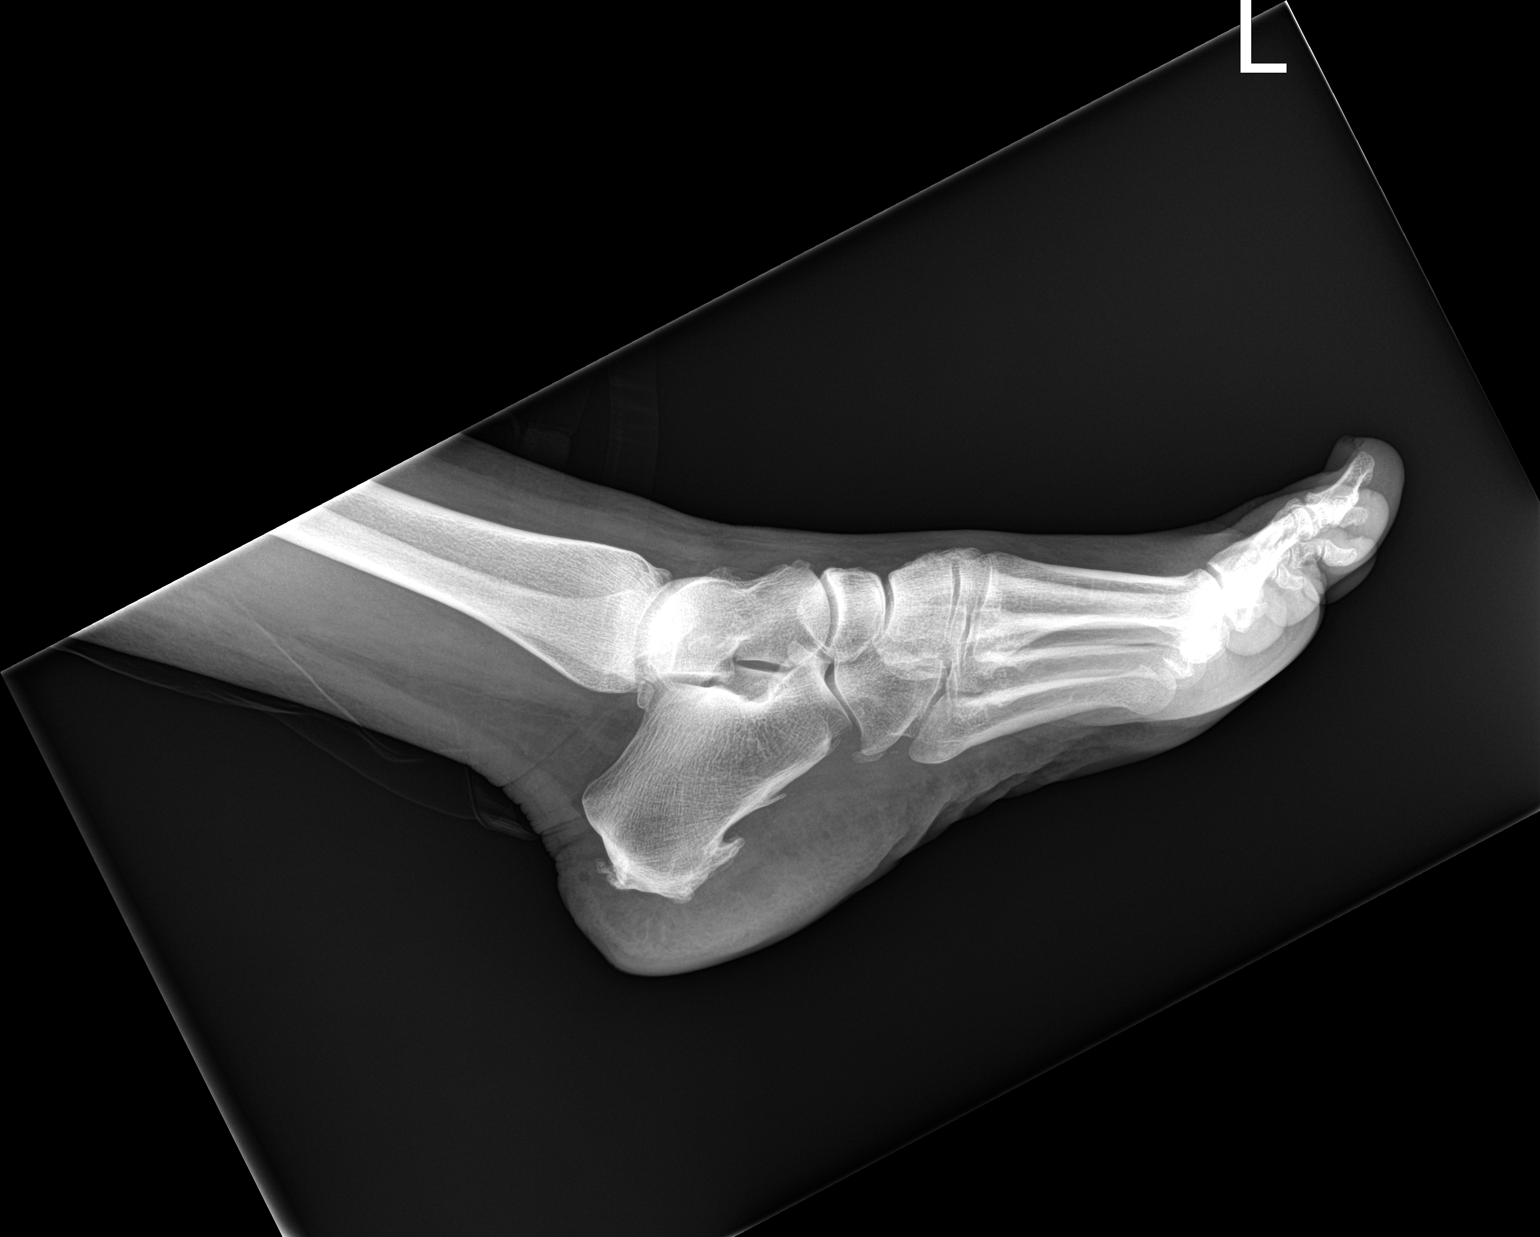

[3 of 3 positions shown; findings below may reference images not displayed]

FINDINGS: There is no evidence of fracture or dislocation. Mild dorsal midfoot
degenerative change, similar prior. Os trigonum. Os navicularis.
Right calcaneal enthesophytes. Soft tissues are unremarkable.
IMPRESSION: No acute findings. Mild dorsal midfoot degenerative change.

## 2023-02-12 DIAGNOSIS — L905 Scar conditions and fibrosis of skin: Secondary | ICD-10-CM | POA: Diagnosis not present

## 2023-02-12 DIAGNOSIS — D485 Neoplasm of uncertain behavior of skin: Secondary | ICD-10-CM | POA: Diagnosis not present

## 2023-02-12 DIAGNOSIS — W57XXXA Bitten or stung by nonvenomous insect and other nonvenomous arthropods, initial encounter: Secondary | ICD-10-CM | POA: Diagnosis not present

## 2023-02-12 DIAGNOSIS — L608 Other nail disorders: Secondary | ICD-10-CM | POA: Diagnosis not present

## 2023-02-12 DIAGNOSIS — I781 Nevus, non-neoplastic: Secondary | ICD-10-CM | POA: Diagnosis not present

## 2023-02-12 DIAGNOSIS — L590 Erythema ab igne [dermatitis ab igne]: Secondary | ICD-10-CM | POA: Diagnosis not present

## 2023-02-12 DIAGNOSIS — Z85828 Personal history of other malignant neoplasm of skin: Secondary | ICD-10-CM | POA: Diagnosis not present

## 2023-02-24 DIAGNOSIS — D485 Neoplasm of uncertain behavior of skin: Secondary | ICD-10-CM | POA: Diagnosis not present

## 2023-02-24 DIAGNOSIS — L570 Actinic keratosis: Secondary | ICD-10-CM | POA: Diagnosis not present

## 2023-02-24 DIAGNOSIS — D0461 Carcinoma in situ of skin of right upper limb, including shoulder: Secondary | ICD-10-CM | POA: Diagnosis not present

## 2023-04-16 ENCOUNTER — Other Ambulatory Visit: Payer: Self-pay | Admitting: Family Medicine

## 2023-04-16 DIAGNOSIS — M5489 Other dorsalgia: Secondary | ICD-10-CM

## 2023-04-16 NOTE — Telephone Encounter (Signed)
Last office visit 12/29/22 Last refill 12/29/22

## 2023-06-23 DIAGNOSIS — D0461 Carcinoma in situ of skin of right upper limb, including shoulder: Secondary | ICD-10-CM | POA: Diagnosis not present

## 2023-06-24 ENCOUNTER — Other Ambulatory Visit: Payer: Self-pay | Admitting: Family

## 2023-06-24 DIAGNOSIS — M5489 Other dorsalgia: Secondary | ICD-10-CM

## 2023-08-30 DIAGNOSIS — R92323 Mammographic fibroglandular density, bilateral breasts: Secondary | ICD-10-CM | POA: Diagnosis not present

## 2023-08-30 DIAGNOSIS — Z1231 Encounter for screening mammogram for malignant neoplasm of breast: Secondary | ICD-10-CM | POA: Diagnosis not present

## 2023-08-30 LAB — HM MAMMOGRAPHY

## 2023-09-02 ENCOUNTER — Telehealth: Payer: Self-pay

## 2023-09-02 DIAGNOSIS — M25552 Pain in left hip: Secondary | ICD-10-CM | POA: Diagnosis not present

## 2023-09-02 DIAGNOSIS — M1612 Unilateral primary osteoarthritis, left hip: Secondary | ICD-10-CM | POA: Diagnosis not present

## 2023-09-02 DIAGNOSIS — M25562 Pain in left knee: Secondary | ICD-10-CM | POA: Diagnosis not present

## 2023-09-02 NOTE — Patient Outreach (Signed)
[facility] Assistant attempted to call patient on today regarding preventative mammogram screening. No answer from patient after multiple rings. Assistant left confidential voicemail for patient to return call.  Will call back patient back for final attempt.  Baruch Gouty [facility] Assistant [facility] [phone]

## 2023-09-06 ENCOUNTER — Encounter: Payer: Self-pay | Admitting: Family Medicine

## 2023-09-14 DIAGNOSIS — M1612 Unilateral primary osteoarthritis, left hip: Secondary | ICD-10-CM | POA: Diagnosis not present

## 2023-09-29 DIAGNOSIS — D0461 Carcinoma in situ of skin of right upper limb, including shoulder: Secondary | ICD-10-CM | POA: Diagnosis not present

## 2023-11-24 DIAGNOSIS — M7062 Trochanteric bursitis, left hip: Secondary | ICD-10-CM | POA: Diagnosis not present

## 2023-11-24 DIAGNOSIS — M1612 Unilateral primary osteoarthritis, left hip: Secondary | ICD-10-CM | POA: Diagnosis not present

## 2023-12-15 DIAGNOSIS — M1612 Unilateral primary osteoarthritis, left hip: Secondary | ICD-10-CM | POA: Diagnosis not present

## 2023-12-21 ENCOUNTER — Other Ambulatory Visit: Payer: Self-pay | Admitting: Family Medicine

## 2023-12-21 DIAGNOSIS — E782 Mixed hyperlipidemia: Secondary | ICD-10-CM

## 2023-12-27 ENCOUNTER — Other Ambulatory Visit: Payer: Self-pay | Admitting: Family Medicine

## 2023-12-27 DIAGNOSIS — F339 Major depressive disorder, recurrent, unspecified: Secondary | ICD-10-CM

## 2023-12-27 DIAGNOSIS — I10 Essential (primary) hypertension: Secondary | ICD-10-CM

## 2023-12-27 DIAGNOSIS — E782 Mixed hyperlipidemia: Secondary | ICD-10-CM

## 2023-12-31 ENCOUNTER — Encounter: Payer: Self-pay | Admitting: Family Medicine

## 2023-12-31 ENCOUNTER — Ambulatory Visit: Payer: Medicare Other | Admitting: Family Medicine

## 2023-12-31 VITALS — BP 126/72 | HR 70 | Temp 98.3°F | Ht 69.0 in | Wt 198.0 lb

## 2023-12-31 DIAGNOSIS — E782 Mixed hyperlipidemia: Secondary | ICD-10-CM

## 2023-12-31 DIAGNOSIS — I10 Essential (primary) hypertension: Secondary | ICD-10-CM

## 2023-12-31 DIAGNOSIS — R7303 Prediabetes: Secondary | ICD-10-CM | POA: Diagnosis not present

## 2023-12-31 DIAGNOSIS — Z0001 Encounter for general adult medical examination with abnormal findings: Secondary | ICD-10-CM

## 2023-12-31 DIAGNOSIS — E559 Vitamin D deficiency, unspecified: Secondary | ICD-10-CM | POA: Diagnosis not present

## 2023-12-31 DIAGNOSIS — M549 Dorsalgia, unspecified: Secondary | ICD-10-CM | POA: Diagnosis not present

## 2023-12-31 DIAGNOSIS — H9193 Unspecified hearing loss, bilateral: Secondary | ICD-10-CM | POA: Diagnosis not present

## 2023-12-31 DIAGNOSIS — Z0000 Encounter for general adult medical examination without abnormal findings: Secondary | ICD-10-CM

## 2023-12-31 DIAGNOSIS — J9801 Acute bronchospasm: Secondary | ICD-10-CM

## 2023-12-31 DIAGNOSIS — Z130 Encounter for screening for diseases of the blood and blood-forming organs and certain disorders involving the immune mechanism: Secondary | ICD-10-CM | POA: Diagnosis not present

## 2023-12-31 DIAGNOSIS — F339 Major depressive disorder, recurrent, unspecified: Secondary | ICD-10-CM

## 2023-12-31 LAB — BAYER DCA HB A1C WAIVED: HB A1C (BAYER DCA - WAIVED): 6 % — ABNORMAL HIGH (ref 4.8–5.6)

## 2023-12-31 MED ORDER — ATORVASTATIN CALCIUM 20 MG PO TABS: 20.0000 mg | ORAL_TABLET | Freq: Every day | ORAL | 4 refills | Status: AC

## 2023-12-31 MED ORDER — LOSARTAN POTASSIUM 100 MG PO TABS: 100.0000 mg | ORAL_TABLET | Freq: Every day | ORAL | 4 refills | Status: AC

## 2023-12-31 MED ORDER — ALBUTEROL SULFATE HFA 108 (90 BASE) MCG/ACT IN AERS: 2.0000 | INHALATION_SPRAY | Freq: Four times a day (QID) | RESPIRATORY_TRACT | 0 refills | Status: AC | PRN

## 2023-12-31 MED ORDER — METHOCARBAMOL 500 MG PO TABS: 500.0000 mg | ORAL_TABLET | Freq: Four times a day (QID) | ORAL | 0 refills | Status: DC

## 2023-12-31 MED ORDER — SERTRALINE HCL 50 MG PO TABS: 50.0000 mg | ORAL_TABLET | Freq: Every day | ORAL | 4 refills | Status: AC

## 2023-12-31 MED ORDER — MONTELUKAST SODIUM 10 MG PO TABS: 10.0000 mg | ORAL_TABLET | Freq: Every day | ORAL | 4 refills | Status: AC

## 2023-12-31 NOTE — Progress Notes (Signed)
 Samantha Olson is a 73 y.o. female presents to office today for annual physical exam examination.    Concerns today include: 1.  Hearing loss She reports that she has been having worsening hearing loss and she is now ready to see an audiologist for assessment.  Would like to get a [place]  2.  Hypertension with hyperlipidemia and prediabetes She admits that she does not really like cooking and therefore does not prepare many vegetables.  Sometimes her supper will consist of a peanut butter sandwich.  She does try to stay active in her garden but does not report any structured physical activities.  She reports sometimes some rib pain after she gardens.  Sometimes she gets short of breath if she goes upstairs.  She reports no edema, orthopnea, hemoptysis or wheezing.  She never used her albuterol.  Occupation: retired, Substance use: none Health Maintenance Due  Topic Date Due   Zoster Vaccines- Shingrix (1 of 2) Never done   [employer] [facility] (AWV)  03/04/2022   INFLUENZA VACCINE  05/13/2023   COVID-19 Vaccine (3 - 2024-25 season) 06/13/2023   Refills needed today:   Immunization History  Administered Date(s) Administered   DTaP 12/11/2009   Fluad Quad(high Dose 65+) 06/16/2019, 08/06/2020   Influenza Split 07/26/2012   Influenza Whole 08/19/2009   Influenza, High Dose Seasonal PF 07/28/2017, 08/22/2018   Influenza,inj,Quad PF,6+ Mos 07/31/2014, 08/09/2015, 10/21/2016   Janssen (J&J) SARS-COV-2 Vaccination 01/20/2020   PFIZER(Purple Top)SARS-COV-2 Vaccination 08/30/2020   Pneumococcal Conjugate-13 07/28/2017   Pneumococcal Polysaccharide-23 08/22/2018   Pneumococcal-Unspecified 08/22/2018   Td 12/13/2009   Tdap 12/10/2020   Past Medical History:  Diagnosis Date   Cataract    Chronic kidney disease 1977   hx ofbladder tumor and cyst on kidney   GERD (gastroesophageal reflux disease)    H. pylori infection    Hiatal hernia    Hyperlipidemia    Hypertension     Vitamin D deficiency    Social History   Socioeconomic History   Marital status: Widowed    Spouse name: Not on file   Number of children: 5   Years of education: Not on file   Highest education level: Not on file  Occupational History   Occupation: retired  Tobacco Use   Smoking status: Never   Smokeless tobacco: Never  Vaping Use   Vaping status: Never Used  Substance and Sexual Activity   Alcohol use: Yes    Comment: rare use   Drug use: No   Sexual activity: Not Currently  Other Topics Concern   Not on file  Social History Narrative   Originally from [place] - 2 children live with her; other 3 children live in [place]; (Enjoys Social research officer, 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) - Her husband passed of covid in 2020 - doesn't care for social groups or activities    Social Drivers of Corporate investment banker Strain: Low Risk  (03/04/2021)   Overall Financial Resource Strain (CARDIA)    Difficulty of Paying Living Expenses: Not hard at all  Food Insecurity: No Food Insecurity (03/04/2021)   Hunger Vital Sign    Worried About Running Out of Food in the Last Year: Never true    Ran Out of Food in the Last Year: Never true  Transportation Needs: No Transportation Needs (03/04/2021)   PRAPARE - Administrator, Civil Service (Medical): No    Lack of Transportation (Non-Medical): No  Physical Activity: Sufficiently Active (03/04/2021)  Exercise Vital Sign    Days of Exercise per Week: 7 days    Minutes of Exercise per Session: 30 min  Stress: No Stress Concern Present (03/04/2021)   [employer] of Occupational Health - Occupational Stress Questionnaire    Feeling of Stress : Only a little  Social Connections: Unknown (02/20/2022)   Received from [facility]   Social Network    Social Network: Not on file  Intimate Partner Violence: Unknown (01/13/2022)   Received from [facility], [facility]   HITS    Physically Hurt: Not on file    Insult or Talk  Down To: Not on file    Threaten Physical Harm: Not on file    Scream or Curse: Not on file   Past Surgical History:  Procedure Laterality Date   Bladder tumor removed     JOINT REPLACEMENT Left 9/13   knee   skin ca removal     Family History  Problem Relation Age of Onset   Cancer Father        stomach cancer   Hypertension Mother    Hyperlipidemia Mother    Stroke Maternal Grandmother    Hypertension Maternal Grandmother     Current Outpatient Medications:    Cholecalciferol (VITAMIN D) 2000 UNITS CAPS, Take by mouth. 1 daily, Disp: , Rfl:    albuterol (VENTOLIN HFA) 108 (90 Base) MCG/ACT inhaler, Inhale 2 puffs into the lungs every 6 (six) hours as needed for wheezing or shortness of breath (put on file)., Disp: 8 g, Rfl: 0   atorvastatin (LIPITOR) 20 MG tablet, Take 1 tablet (20 mg total) by mouth daily., Disp: 90 tablet, Rfl: 4   losartan (COZAAR) 100 MG tablet, Take 1 tablet (100 mg total) by mouth daily., Disp: 90 tablet, Rfl: 4   methocarbamol (ROBAXIN) 500 MG tablet, Take 1 tablet (500 mg total) by mouth 4 (four) times daily., Disp: 60 tablet, Rfl: 0   montelukast (SINGULAIR) 10 MG tablet, Take 1 tablet (10 mg total) by mouth at bedtime. For allergy and bronchospasm, Disp: 90 tablet, Rfl: 4   sertraline (ZOLOFT) 50 MG tablet, Take 1 tablet (50 mg total) by mouth daily., Disp: 90 tablet, Rfl: 4  Allergies  Allergen Reactions   Other Itching and Rash    NICKEL METAL   Ace Inhibitors Cough   Celexa [Citalopram Hydrobromide]     Shakes- chest and arms   Penicillins Swelling   Tape      ROS: Review of Systems A comprehensive review of systems was negative except for: Genitourinary: positive for occasional urinary incontinence relieved by stopping sodas    Physical exam BP 126/72   Pulse 70   Temp 98.3 F (36.8 C)   Ht 5\' 9"  (1.753 m)   Wt 198 lb (89.8 kg)   SpO2 99%   BMI 29.24 kg/m  General appearance: alert, cooperative, appears stated age, and no  distress Head: Normocephalic, without obvious abnormality, atraumatic Eyes: negative findings: lids and lashes normal, conjunctivae and sclerae normal, corneas clear, and pupils equal, round, reactive to light and accomodation, wears glasses. Ears: normal TM's and external ear canal left, right slightly obscured by cerumen Nose: Nares normal. Septum midline. Mucosa normal. No drainage or sinus tenderness. Throat: lips, mucosa, and tongue normal; teeth and gums normal Neck: no adenopathy, supple, symmetrical, trachea midline, and thyroid not enlarged, symmetric, no tenderness/mass/nodules Back: symmetric, no curvature. ROM normal. No CVA tenderness. Lungs: clear to auscultation bilaterally Heart: regular rate and  rhythm, S1, S2 normal, no murmur, click, rub or gallop Abdomen: soft, non-tender; bowel sounds normal; no masses,  no organomegaly Extremities: extremities normal, atraumatic, no cyanosis or edema Pulses: 2+ and symmetric Skin: Skin color, texture, turgor normal. No rashes or lesions Lymph nodes: Cervical, supraclavicular, and axillary nodes normal. Neurologic: Grossly normal      12/31/2023   10:41 AM 12/31/2023   10:33 AM 12/30/2022    8:28 AM  Depression screen PHQ 2/9  Decreased Interest 1 0 0  Down, Depressed, Hopeless 1 0 0  PHQ - 2 Score 2 0 0  Altered sleeping 1 0 1  Tired, decreased energy 1 0 1  Change in appetite 1 0 2  Feeling bad or failure about yourself  1 0   Trouble concentrating 1 0 1  Moving slowly or fidgety/restless 0 0 0  Suicidal thoughts 0 0 0  PHQ-9 Score 7 0 5  Difficult doing work/chores Somewhat difficult Not difficult at all Somewhat difficult      12/31/2023   10:40 AM 12/31/2023   10:33 AM 12/30/2022    8:28 AM 10/28/2021   12:55 PM  GAD 7 : Generalized Anxiety Score  Nervous, Anxious, on Edge 1 0 1 0  Control/stop worrying 1 0 0 0  Worry too much - different things 1 0 1 0  Trouble relaxing 1 0 1 1  Restless 0 0 1 1  Easily annoyed or  irritable 1 0 0 0  Afraid - awful might happen 1 0 1 1  Total GAD 7 Score 6 0 5 3  Anxiety Difficulty Somewhat difficult Not difficult at all Somewhat difficult Somewhat difficult     Assessment/ Plan: Jacklynn Ganong here for annual physical exam.   Annual physical exam  Bilateral hearing loss, unspecified hearing loss type - Plan: Ambulatory referral to Audiology  Mixed hyperlipidemia - Plan: CMP14+EGFR, Lipid Panel, CT CARDIAC SCORING (SELF PAY ONLY), atorvastatin (LIPITOR) 20 MG tablet  Essential hypertension - Plan: CMP14+EGFR, CT CARDIAC SCORING (SELF PAY ONLY), losartan (COZAAR) 100 MG tablet  Pre-diabetes - Plan: Bayer DCA Hb A1c Waived, CT CARDIAC SCORING (SELF PAY ONLY)  Depression, recurrent (HCC) - Plan: CMP14+EGFR, sertraline (ZOLOFT) 50 MG tablet  Vitamin D deficiency - Plan: VITAMIN D 25 Hydroxy (Vit-D Deficiency, Fractures)  Bronchospasm - Plan: CBC, albuterol (VENTOLIN HFA) 108 (90 Base) MCG/ACT inhaler, montelukast (SINGULAIR) 10 MG tablet  Back pain without sciatica - Plan: methocarbamol (ROBAXIN) 500 MG tablet  Will check [facility] to evaluate for influenza, shingles vaccination which [facility]  I have referred her to audiology as requested for progressive hearing loss  Fasting labs were collected today and we will also obtain a coronary artery calcium scoring given some atypical chest pain she has been experiencing intermittently and reports of intermittent shortness of breath which may be secondary to physical deconditioning but given cardiac risk factors I think a CT coronary artery calcium score is appropriate and we will place referral pending results  Depression is chronic and stable and well-controlled with Zoloft.  Check renal function, vitamin D, CBC.  Ventolin placed on file.  Singulair renewed  Robaxin renewed for sparing use.  No active pain now   Counseled on healthy lifestyle choices, including diet (rich in fruits,  vegetables and lean meats and low in salt and simple carbohydrates) and exercise (at least 30 minutes of moderate physical activity daily).  Patient to follow up 1 year CPE  Jurney Overacker M. Nadine Counts, DO

## 2023-12-31 NOTE — Patient Instructions (Signed)
Coronary Calcium Scan A coronary calcium scan is an imaging test used to look for deposits of plaque in the inner lining of the blood vessels of the heart (coronary arteries). Plaque is made up of calcium, protein, and fatty substances. These deposits of plaque can partly clog and narrow the coronary arteries without producing any symptoms or warning signs. This puts a person at risk for a heart attack. A coronary calcium scan is performed using a computed tomography (CT) scanner machine without using a dye (contrast). This test is recommended for people who are at moderate risk for heart disease. The test can find plaque deposits before symptoms develop. Tell a health care provider about: Any allergies you have. All medicines you are taking, including vitamins, herbs, eye drops, creams, and over-the-counter medicines. Any problems you or family members have had with anesthetic medicines. Any bleeding problems you have. Any surgeries you have had. Any medical conditions you have. Whether you are pregnant or may be pregnant. What are the risks? Generally, this is a safe procedure. However, problems may occur, including: Harm to a pregnant woman and her unborn baby. This test involves the use of radiation. Radiation exposure can be dangerous to a pregnant woman and her unborn baby. If you are pregnant or think you may be pregnant, you should not have this procedure done. A slight increase in the risk of cancer. This is because of the radiation involved in the test. The amount of radiation from one test is similar to the amount of radiation you are naturally exposed to over one year. What happens before the procedure? Ask your health care provider for any specific instructions on how to prepare for this procedure. You may be asked to avoid products that contain caffeine, tobacco, or nicotine for 4 hours before the procedure. What happens during the procedure?  You will undress and remove any jewelry  from your neck or chest. You may need to remove hearing aides and dentures. Women may need to remove their bras. You will put on a hospital gown. Sticky electrodes will be placed on your chest. The electrodes will be connected to an electrocardiogram (ECG) machine to record a tracing of the electrical activity of your heart. You will lie down on your back on a curved bed that is attached to the CT scanner. You may be given medicine to slow down your heart rate so that clear pictures can be created. You will be moved into the CT scanner, and the CT scanner will take pictures of your heart. During this time, you will be asked to lie still and hold your breath for 10-20 seconds at a time while each picture of your heart is being taken. The procedure may vary among health care providers and hospitals. What can I expect after the procedure? You can return to your normal activities. It is up to you to get the results of your procedure. Ask your health care provider, or the department that is doing the procedure, when your results will be ready. Summary A coronary calcium scan is an imaging test used to look for deposits of plaque in the inner lining of the blood vessels of the heart. Plaque is made up of calcium, protein, and fatty substances. A coronary calcium scan is performed using a CT scanner machine without contrast. Generally, this is a safe procedure. Tell your health care provider if you are pregnant or may be pregnant. Ask your health care provider for any specific instructions on how to  prepare for this procedure. You can return to your normal activities after the scan is done. This information is not intended to replace advice given to you by your health care provider. Make sure you discuss any questions you have with your health care provider. Document Revised: 09/07/2021 Document Reviewed: 09/07/2021 Elsevier Patient Education  2024 [employer].

## 2024-01-01 LAB — CBC
Hematocrit: 42.6 % (ref 34.0–46.6)
Hemoglobin: 14.3 g/dL (ref 11.1–15.9)
MCH: 30.4 pg (ref 26.6–33.0)
MCHC: 33.6 g/dL (ref 31.5–35.7)
MCV: 90 fL (ref 79–97)
Platelets: 233 10*3/uL (ref 150–450)
RBC: 4.71 x10E6/uL (ref 3.77–5.28)
RDW: 13 % (ref 11.7–15.4)
WBC: 8.1 10*3/uL (ref 3.4–10.8)

## 2024-01-01 LAB — CMP14+EGFR
ALT: 19 IU/L (ref 0–32)
AST: 15 IU/L (ref 0–40)
Albumin: 4.2 g/dL (ref 3.8–4.8)
Alkaline Phosphatase: 79 IU/L (ref 44–121)
BUN/Creatinine Ratio: 25 (ref 12–28)
BUN: 23 mg/dL (ref 8–27)
Bilirubin Total: 0.7 mg/dL (ref 0.0–1.2)
CO2: 25 mmol/L (ref 20–29)
Calcium: 9.9 mg/dL (ref 8.7–10.3)
Chloride: 105 mmol/L (ref 96–106)
Creatinine, Ser: 0.92 mg/dL (ref 0.57–1.00)
Globulin, Total: 2 g/dL (ref 1.5–4.5)
Glucose: 103 mg/dL — ABNORMAL HIGH (ref 70–99)
Potassium: 4.6 mmol/L (ref 3.5–5.2)
Sodium: 141 mmol/L (ref 134–144)
Total Protein: 6.2 g/dL (ref 6.0–8.5)
eGFR: 67 mL/min/{1.73_m2} (ref >59)

## 2024-01-01 LAB — VITAMIN D 25 HYDROXY (VIT D DEFICIENCY, FRACTURES): Vit D, 25-Hydroxy: 13.1 ng/mL — ABNORMAL LOW (ref 30.0–100.0)

## 2024-01-01 LAB — LIPID PANEL
Chol/HDL Ratio: 1.9 ratio (ref 0.0–4.4)
Cholesterol, Total: 181 mg/dL (ref 100–199)
HDL: 96 mg/dL (ref >39)
LDL Chol Calc (NIH): 73 mg/dL (ref 0–99)
Triglycerides: 62 mg/dL (ref 0–149)
VLDL Cholesterol Cal: 12 mg/dL (ref 5–40)

## 2024-01-03 ENCOUNTER — Encounter: Payer: Self-pay | Admitting: Family Medicine

## 2024-01-03 ENCOUNTER — Other Ambulatory Visit: Payer: Self-pay | Admitting: Family Medicine

## 2024-01-03 DIAGNOSIS — E559 Vitamin D deficiency, unspecified: Secondary | ICD-10-CM

## 2024-01-03 MED ORDER — VITAMIN D (ERGOCALCIFEROL) 1.25 MG (50000 UNIT) PO CAPS: 50000.0000 [IU] | ORAL_CAPSULE | ORAL | 3 refills | Status: AC

## 2024-01-06 DIAGNOSIS — H43393 Other vitreous opacities, bilateral: Secondary | ICD-10-CM | POA: Diagnosis not present

## 2024-01-10 ENCOUNTER — Other Ambulatory Visit: Payer: Self-pay | Admitting: Family Medicine

## 2024-02-14 DIAGNOSIS — D485 Neoplasm of uncertain behavior of skin: Secondary | ICD-10-CM | POA: Diagnosis not present

## 2024-02-14 DIAGNOSIS — L821 Other seborrheic keratosis: Secondary | ICD-10-CM | POA: Diagnosis not present

## 2024-02-14 DIAGNOSIS — L84 Corns and callosities: Secondary | ICD-10-CM | POA: Diagnosis not present

## 2024-02-14 DIAGNOSIS — Z85828 Personal history of other malignant neoplasm of skin: Secondary | ICD-10-CM | POA: Diagnosis not present

## 2024-02-14 DIAGNOSIS — L304 Erythema intertrigo: Secondary | ICD-10-CM | POA: Diagnosis not present

## 2024-02-14 DIAGNOSIS — L905 Scar conditions and fibrosis of skin: Secondary | ICD-10-CM | POA: Diagnosis not present

## 2024-02-16 ENCOUNTER — Other Ambulatory Visit (HOSPITAL_BASED_OUTPATIENT_CLINIC_OR_DEPARTMENT_OTHER): Payer: Self-pay

## 2024-02-25 DIAGNOSIS — L570 Actinic keratosis: Secondary | ICD-10-CM | POA: Diagnosis not present

## 2024-02-25 DIAGNOSIS — D485 Neoplasm of uncertain behavior of skin: Secondary | ICD-10-CM | POA: Diagnosis not present

## 2024-02-25 DIAGNOSIS — L821 Other seborrheic keratosis: Secondary | ICD-10-CM | POA: Diagnosis not present

## 2024-03-13 ENCOUNTER — Encounter: Payer: Self-pay | Admitting: Family Medicine

## 2024-04-05 ENCOUNTER — Ambulatory Visit: Admitting: Family Medicine

## 2024-04-05 VITALS — BP 123/64 | HR 68 | Temp 97.9°F | Ht 69.0 in | Wt 199.0 lb

## 2024-04-05 DIAGNOSIS — H6121 Impacted cerumen, right ear: Secondary | ICD-10-CM

## 2024-04-05 DIAGNOSIS — B351 Tinea unguium: Secondary | ICD-10-CM | POA: Diagnosis not present

## 2024-04-05 DIAGNOSIS — H9311 Tinnitus, right ear: Secondary | ICD-10-CM | POA: Diagnosis not present

## 2024-04-05 MED ORDER — TERBINAFINE HCL 250 MG PO TABS: 250.0000 mg | ORAL_TABLET | Freq: Every day | ORAL | 0 refills | Status: AC

## 2024-04-05 NOTE — Patient Instructions (Signed)

## 2024-04-05 NOTE — Progress Notes (Signed)
 Subjective: RR:upwwpuld PCP: Jolinda Norene HERO, DO YEP:[name] is a 73 y.o. female presenting to clinic today for:  1. Tinnitus She reports she has had right sided tinnitus for at least 1 week.  She tried utilizing Debrox in efforts to alleviate this but has really not been successful and now she starts having some muffled hearing on that side.  She does not report any dizziness  2.  Onychomycosis Longstanding issue but it seems to be getting worse.  Willing to know what to do to treat this.   ROS: Per HPI  Allergies  Allergen Reactions   Other Itching and Rash    NICKEL METAL   Ace Inhibitors Cough   Celexa [Citalopram Hydrobromide]     Shakes- chest and arms   Penicillins Swelling   Tape    Past Medical History:  Diagnosis Date   Cataract    Chronic kidney disease 1977   hx ofbladder tumor and cyst on kidney   GERD (gastroesophageal reflux disease)    H. pylori infection    Hiatal hernia    Hyperlipidemia    Hypertension    Vitamin D  deficiency     Current Outpatient Medications:    albuterol  (VENTOLIN  HFA) 108 (90 Base) MCG/ACT inhaler, Inhale 2 puffs into the lungs every 6 (six) hours as needed for wheezing or shortness of breath (put on file)., Disp: 8 g, Rfl: 0   atorvastatin  (LIPITOR) 20 MG tablet, Take 1 tablet (20 mg total) by mouth daily., Disp: 90 tablet, Rfl: 4   Cholecalciferol (VITAMIN D ) 2000 UNITS CAPS, Take by mouth. 1 daily, Disp: , Rfl:    losartan  (COZAAR ) 100 MG tablet, Take 1 tablet (100 mg total) by mouth daily., Disp: 90 tablet, Rfl: 4   methocarbamol  (ROBAXIN ) 500 MG tablet, Take 1 tablet (500 mg total) by mouth 4 (four) times daily., Disp: 60 tablet, Rfl: 0   montelukast  (SINGULAIR ) 10 MG tablet, Take 1 tablet (10 mg total) by mouth at bedtime. For allergy and bronchospasm, Disp: 90 tablet, Rfl: 4   sertraline  (ZOLOFT ) 50 MG tablet, Take 1 tablet (50 mg total) by mouth daily., Disp: 90 tablet, Rfl: 4   Vitamin D , Ergocalciferol ,  (DRISDOL ) 1.25 MG (50000 UNIT) CAPS capsule, Take 1 capsule (50,000 Units total) by mouth every 7 (seven) days., Disp: 12 capsule, Rfl: 3 Social History   Socioeconomic History   Marital status: Widowed    Spouse name: Not on file   Number of children: 5   Years of education: Not on file   Highest education level: Not on file  Occupational History   Occupation: retired  Tobacco Use   Smoking status: Never   Smokeless tobacco: Never  Vaping Use   Vaping status: Never Used  Substance and Sexual Activity   Alcohol use: Yes    Comment: rare use   Drug use: No   Sexual activity: Not Currently  Other Topics Concern   Not on file  Social History Narrative   Originally from [place]  - 2 children live with her; other 3 children live in [place]; (Enjoys Social research officer, 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) - Her husband passed of covid in 2020 - doesn't care for social groups or activities    Social Drivers of Corporate investment banker Strain: Low Risk  (03/04/2021)   Overall Financial Resource Strain (CARDIA)    Difficulty of Paying Living Expenses: Not hard at all  Food Insecurity: No Food Insecurity (03/04/2021)   Hunger Vital  Sign    Worried About Programme researcher, broadcasting/film/video in the Last Year: Never true    Ran Out of Food in the Last Year: Never true  Transportation Needs: No Transportation Needs (03/04/2021)   PRAPARE - Administrator, Civil Service (Medical): No    Lack of Transportation (Non-Medical): No  Physical Activity: Sufficiently Active (03/04/2021)   Exercise Vital Sign    Days of Exercise per Week: 7 days    Minutes of Exercise per Session: 30 min  Stress: No Stress Concern Present (03/04/2021)   [employer] of Occupational Health - Occupational Stress Questionnaire    Feeling of Stress : Only a little  Social Connections: Unknown (02/20/2022)   [facility]   Social Network    Social Network: Not on file  Intimate Partner Violence: Unknown (01/13/2022)    Received from [facility]   HITS    Physically Hurt: Not on file    Insult or Talk Down To: Not on file    Threaten Physical Harm: Not on file    Scream or Curse: Not on file   Family History  Problem Relation Age of Onset   Cancer Father        stomach cancer   Hypertension Mother    Hyperlipidemia Mother    Stroke Maternal Grandmother    Hypertension Maternal Grandmother     Objective: Office vital signs reviewed. BP 123/64   Pulse 68   Temp 97.9 F (36.6 C)   Ht 5' 9 (1.753 m)   Wt 199 lb (90.3 kg)   SpO2 93%   BMI 29.39 kg/m   Physical Examination:  General: Awake, alert, well nourished, No acute distress HEENT: Right TM totally obscured by cerumen.  Left TM normal in appearance with normal light reflex Skin: Onychomycotic changes to the great toenails bilaterally with thickening and brittle appearance  Assessment/ Plan: 73 y.o. female   Impacted cerumen of right ear  Tinnitus, right  Onychomycosis - Plan: terbinafine (LAMISIL) 250 MG tablet, Hepatic function panel  Irrigation of the right ear performed and was successful  Trial of Lamisil.  Discussed need for sanitization of all floors and shoes   Samantha Olson [name], DO [facility] [phone]

## 2024-04-27 ENCOUNTER — Other Ambulatory Visit: Payer: Self-pay | Admitting: Family Medicine

## 2024-04-27 DIAGNOSIS — M549 Dorsalgia, unspecified: Secondary | ICD-10-CM

## 2024-05-10 DIAGNOSIS — M25552 Pain in left hip: Secondary | ICD-10-CM | POA: Diagnosis not present

## 2024-05-17 ENCOUNTER — Other Ambulatory Visit

## 2024-05-30 DIAGNOSIS — L570 Actinic keratosis: Secondary | ICD-10-CM | POA: Diagnosis not present

## 2024-06-09 ENCOUNTER — Encounter: Payer: Self-pay | Admitting: Family Medicine

## 2024-06-09 ENCOUNTER — Ambulatory Visit: Admitting: Family Medicine

## 2024-06-09 ENCOUNTER — Ambulatory Visit: Payer: Self-pay | Admitting: Family Medicine

## 2024-06-09 ENCOUNTER — Ambulatory Visit: Payer: Self-pay

## 2024-06-09 VITALS — BP 137/82 | HR 71 | Temp 98.2°F | Ht 69.0 in | Wt 194.0 lb

## 2024-06-09 DIAGNOSIS — N3001 Acute cystitis with hematuria: Secondary | ICD-10-CM

## 2024-06-09 DIAGNOSIS — R350 Frequency of micturition: Secondary | ICD-10-CM | POA: Diagnosis not present

## 2024-06-09 DIAGNOSIS — R102 Pelvic and perineal pain: Secondary | ICD-10-CM | POA: Diagnosis not present

## 2024-06-09 DIAGNOSIS — R300 Dysuria: Secondary | ICD-10-CM | POA: Diagnosis not present

## 2024-06-09 LAB — MICROSCOPIC EXAMINATION
RBC, Urine: >30 /HPF — AB (ref 0–2)
Renal Epithel, UA: NONE SEEN /HPF
WBC, UA: >30 /HPF — AB (ref 0–5)
Yeast, UA: NONE SEEN

## 2024-06-09 LAB — URINALYSIS, ROUTINE W REFLEX MICROSCOPIC
Bilirubin, UA: NEGATIVE
Glucose, UA: NEGATIVE
Ketones, UA: NEGATIVE
Nitrite, UA: NEGATIVE
Specific Gravity, UA: 1.015 (ref 1.005–1.030)
Urobilinogen, Ur: 0.2 mg/dL (ref 0.2–1.0)
pH, UA: 6 (ref 5.0–7.5)

## 2024-06-09 MED ORDER — SULFAMETHOXAZOLE-TRIMETHOPRIM 800-160 MG PO TABS: 1.0000 | ORAL_TABLET | Freq: Two times a day (BID) | ORAL | 0 refills | Status: AC

## 2024-06-09 MED ORDER — PHENAZOPYRIDINE HCL 100 MG PO TABS: 100.0000 mg | ORAL_TABLET | Freq: Three times a day (TID) | ORAL | 0 refills | Status: AC | PRN

## 2024-06-09 NOTE — Telephone Encounter (Signed)
 FYI Only or Action Required?: Action required by provider: request for appointment.  Patient was last seen in primary care on 04/05/2024 by Jolinda Norene HERO, DO.  Called Nurse Triage reporting urinary symptoms.  Symptoms began several days ago.  Interventions attempted: Nothing.  Symptoms are: gradually worsening.Burning with voiding, possibly not emptying.  Triage Disposition: See Physician Within 24 Hours  Patient/caregiver understands and will follow disposition?: Yes  Copied from CRM #[id number]. Topic: Clinical - Red Word Triage >> Jun 09, 2024  9:13 AM Delon HERO wrote: Red Word that prompted transfer to Nurse Triage: Pain with urination, urinating less, and pain in the lower abdominal area. Pain started yesterday. Reason for Disposition  Urinating more frequently than usual (i.e., frequency) OR new-onset of the feeling of an urgent need to urinate (i.e., urgency)  Answer Assessment - Initial Assessment Questions 1. SYMPTOM: What's the main symptom you're concerned about? (e.g., frequency, incontinence)     Burning, low abdominal pain 2. ONSET: When did the    start?     2 days 3. PAIN: Is there any pain? If Yes, ask: How bad is it? (Scale: 1-10; mild, moderate, severe)     6=7 4. CAUSE: What do you think is causing the symptoms?     UTI 5. OTHER SYMPTOMS: Do you have any other symptoms? (e.g., blood in urine, fever, flank pain, pain with urination)     NO 6. PREGNANCY: Is there any chance you are pregnant? When was your last menstrual period?     NO  Protocols used: Urinary Symptoms-A-AH

## 2024-06-09 NOTE — Progress Notes (Signed)
 Subjective:  Patient ID: Dagoberto LELON Fireman, female    DOB: [birth year], 73 y.o.   MRN: [id number]  Patient Care Team: Jolinda Norene HERO, DO as PCP - General (Family Medicine) Homer Mems, MD (Ophthalmology) Gershon Donnice SAUNDERS, DPM as Consulting Physician (Podiatry)   Chief Complaint:  Dysuria (Low abdominal/pelvic pain/Burning/Urgency with little output)   HPI: VANETA HAMMONTREE is a 73 y.o. female presenting on 06/09/2024 for Dysuria (Low abdominal/pelvic pain/Burning/Urgency with little output)   BRISEIS AGUILERA is a 73 year old female who presents with urinary symptoms.  She has been experiencing dysuria, urinary frequency, and lower abdominal pain. These symptoms began yesterday, with a possible mild onset the day before, but she initially did not think it was significant. The symptoms have since worsened.  No fever, chills, discharge, bleeding, hematuria, nausea, vomiting, or confusion. She mentions experiencing lower back pain due to a fall earlier in the week, but no pain related to her current urinary symptoms.  She attempted to increase her water intake, but it did not alleviate her symptoms. She has not tried any other treatments at this point.  Her last urine culture in 2022 showed susceptibility to all tested antibiotics, and she was treated with doxycycline  at that time, which resolved the infection as far as she knows.          Relevant past medical, surgical, family, and social history reviewed and updated as indicated.  Allergies and medications reviewed and updated. Data reviewed: Chart in Epic.   Past Medical History:  Diagnosis Date   Cataract    Chronic kidney disease 1977   hx ofbladder tumor and cyst on kidney   GERD (gastroesophageal reflux disease)    H. pylori infection    Hiatal hernia    Hyperlipidemia    Hypertension    Vitamin D  deficiency     Past Surgical History:  Procedure Laterality Date   Bladder tumor removed     JOINT REPLACEMENT Left  9/13   knee   skin ca removal      Social History   Socioeconomic History   Marital status: Widowed    Spouse name: Not on file   Number of children: 5   Years of education: Not on file   Highest education level: 12th grade  Occupational History   Occupation: retired  Tobacco Use   Smoking status: Never   Smokeless tobacco: Never  Vaping Use   Vaping status: Never Used  Substance and Sexual Activity   Alcohol use: Yes    Comment: rare use   Drug use: No   Sexual activity: Not Currently  Other Topics Concern   Not on file  Social History Narrative   Originally from [place]  - 2 children live with her; other 3 children live in [place]; (Enjoys Social research officer, 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) - Her husband passed of covid in 2020 - doesn't care for social groups or activities    Social Drivers of Corporate investment banker Strain: Low Risk  (04/05/2024)   Overall Financial Resource Strain (CARDIA)    Difficulty of Paying Living Expenses: Not hard at all  Food Insecurity: No Food Insecurity (04/05/2024)   Hunger Vital Sign    Worried About Running Out of Food in the Last Year: Never true    Ran Out of Food in the Last Year: Never true  Transportation Needs: No Transportation Needs (04/05/2024)   PRAPARE - Administrator, Civil Service (Medical):  No    Lack of Transportation (Non-Medical): No  Physical Activity: Inactive (04/05/2024)   Exercise Vital Sign    Days of Exercise per Week: 0 days    Minutes of Exercise per Session: Not on file  Stress: No Stress Concern Present (04/05/2024)   [employer] of Occupational Health - Occupational Stress Questionnaire    Feeling of Stress: Only a little  Social Connections: Socially Isolated (04/05/2024)   Social Connection and Isolation Panel    Frequency of Communication with Friends and Family: Once a week    Frequency of Social Gatherings with Friends and Family: Once a week    Attends Religious Services: 1 to 4 times per  year    Active Member of [employer] or Organizations: No    Attends Banker Meetings: Not on file    Marital Status: Widowed  Intimate Partner Violence: Unknown (01/13/2022)   Received from [facility]   HITS    Physically Hurt: Not on file    Insult or Talk Down To: Not on file    Threaten Physical Harm: Not on file    Scream or Curse: Not on file    Outpatient Encounter Medications as of 06/09/2024  Medication Sig   albuterol  (VENTOLIN  HFA) 108 (90 Base) MCG/ACT inhaler Inhale 2 puffs into the lungs every 6 (six) hours as needed for wheezing or shortness of breath (put on file).   atorvastatin  (LIPITOR) 20 MG tablet Take 1 tablet (20 mg total) by mouth daily.   celecoxib (CELEBREX) 100 MG capsule TAKE 2 CAPSULES BY MOUTH WITH BREAKFAST AND WITH EVENING MEAL   Cholecalciferol (VITAMIN D ) 2000 UNITS CAPS Take by mouth. 1 daily   hydrochlorothiazide  (HYDRODIURIL ) 25 MG tablet Take 25 mg by mouth.   losartan  (COZAAR ) 100 MG tablet Take 1 tablet (100 mg total) by mouth daily.   methocarbamol  (ROBAXIN ) 500 MG tablet Take 1 tablet (500 mg total) by mouth every 6 (six) hours as needed for muscle spasms.   montelukast  (SINGULAIR ) 10 MG tablet Take 1 tablet (10 mg total) by mouth at bedtime. For allergy and bronchospasm   phenazopyridine  (PYRIDIUM ) 100 MG tablet Take 1 tablet (100 mg total) by mouth 3 (three) times daily as needed for pain.   sertraline  (ZOLOFT ) 50 MG tablet Take 1 tablet (50 mg total) by mouth daily.   sulfamethoxazole -trimethoprim  (BACTRIM  DS) 800-160 MG tablet Take 1 tablet by mouth 2 (two) times daily for 7 days.   terbinafine  (LAMISIL ) 250 MG tablet Take 1 tablet (250 mg total) by mouth daily.   TOLAK 4 % CREA Apply to area on nose every night without covering, for 4 weeks. Wash off in the morning.   Vitamin D , Ergocalciferol , (DRISDOL ) 1.25 MG (50000 UNIT) CAPS capsule Take 1 capsule (50,000 Units total) by mouth every 7 (seven) days.   No facility-administered  encounter medications on file as of 06/09/2024.    Allergies  Allergen Reactions   Other Itching and Rash    NICKEL METAL   Ace Inhibitors Cough   Celexa [Citalopram Hydrobromide]     Shakes- chest and arms   Penicillins Swelling   Tape     Pertinent ROS per HPI, otherwise unremarkable      Objective:  BP 137/82   Pulse 71   Temp 98.2 F (36.8 C)   Ht 5' 9 (1.753 m)   Wt 194 lb (88 kg)   SpO2 97%   BMI 28.65 kg/m    Wt Readings from Last 3  Encounters:  06/09/24 194 lb (88 kg)  04/05/24 199 lb (90.3 kg)  12/31/23 198 lb (89.8 kg)    Physical Exam Vitals and nursing note reviewed.  Constitutional:      Appearance: Normal appearance.  HENT:     Head: Normocephalic and atraumatic.     Mouth/Throat:     Mouth: Mucous membranes are moist.  Eyes:     Pupils: Pupils are equal, round, and reactive to light.  Cardiovascular:     Rate and Rhythm: Normal rate and regular rhythm.     Heart sounds: Normal heart sounds.  Pulmonary:     Effort: Pulmonary effort is normal.     Breath sounds: Normal breath sounds.  Abdominal:     General: Bowel sounds are normal.     Palpations: Abdomen is soft.     Tenderness: There is no abdominal tenderness. There is no right CVA tenderness or left CVA tenderness.  Skin:    General: Skin is warm and dry.     Capillary Refill: Capillary refill takes less than 2 seconds.  Neurological:     General: No focal deficit present.     Mental Status: She is alert and oriented to person, place, and time.  Psychiatric:        Mood and Affect: Mood normal.        Behavior: Behavior normal.        Thought Content: Thought content normal.        Judgment: Judgment normal.      Results for orders placed or performed in visit on 12/31/23  Bayer DCA Hb A1c Waived   Collection Time: 12/31/23 11:10 AM  Result Value Ref Range   HB A1C (BAYER DCA - WAIVED) 6.0 (H) 4.8 - 5.6 %  CMP14+EGFR   Collection Time: 12/31/23 11:11 AM  Result Value Ref  Range   Glucose 103 (H) 70 - 99 mg/dL   BUN 23 8 - 27 mg/dL   Creatinine, Ser 9.07 0.57 - 1.00 mg/dL   eGFR 67 >40 fO/fpw/8.26   BUN/Creatinine Ratio 25 12 - 28   Sodium 141 134 - 144 mmol/L   Potassium 4.6 3.5 - 5.2 mmol/L   Chloride 105 96 - 106 mmol/L   CO2 25 20 - 29 mmol/L   Calcium  9.9 8.7 - 10.3 mg/dL   Total Protein 6.2 6.0 - 8.5 g/dL   Albumin 4.2 3.8 - 4.8 g/dL   Globulin, Total 2.0 1.5 - 4.5 g/dL   Bilirubin Total 0.7 0.0 - 1.2 mg/dL   Alkaline Phosphatase 79 44 - 121 IU/L   AST 15 0 - 40 IU/L   ALT 19 0 - 32 IU/L  Lipid Panel   Collection Time: 12/31/23 11:11 AM  Result Value Ref Range   Cholesterol, Total 181 100 - 199 mg/dL   Triglycerides 62 0 - 149 mg/dL   HDL 96 >60 mg/dL   VLDL Cholesterol Cal 12 5 - 40 mg/dL   LDL Chol Calc (NIH) 73 0 - 99 mg/dL   Chol/HDL Ratio 1.9 0.0 - 4.4 ratio  CBC   Collection Time: 12/31/23 11:11 AM  Result Value Ref Range   WBC 8.1 3.4 - 10.8 x10E3/uL   RBC 4.71 3.77 - 5.28 x10E6/uL   Hemoglobin 14.3 11.1 - 15.9 g/dL   Hematocrit 57.3 65.9 - 46.6 %   MCV 90 79 - 97 fL   MCH 30.4 26.6 - 33.0 pg   MCHC 33.6 31.5 - 35.7 g/dL   RDW  13.0 11.7 - 15.4 %   Platelets 233 150 - 450 x10E3/uL  VITAMIN D  25 Hydroxy (Vit-D Deficiency, Fractures)   Collection Time: 12/31/23 11:11 AM  Result Value Ref Range   Vit D, 25-Hydroxy 13.1 (L) 30.0 - 100.0 ng/mL       Pertinent labs & imaging results that were available during my care of the patient were reviewed by me and considered in my medical decision making.  Assessment & Plan:  Danasha was seen today for dysuria.  Diagnoses and all orders for this visit:  Acute cystitis with hematuria -     Urine Culture -     sulfamethoxazole -trimethoprim  (BACTRIM  DS) 800-160 MG tablet; Take 1 tablet by mouth 2 (two) times daily for 7 days. -     phenazopyridine  (PYRIDIUM ) 100 MG tablet; Take 1 tablet (100 mg total) by mouth 3 (three) times daily as needed for pain.  Dysuria -     Urinalysis, Routine  w reflex microscopic -     Urine Culture -     phenazopyridine  (PYRIDIUM ) 100 MG tablet; Take 1 tablet (100 mg total) by mouth 3 (three) times daily as needed for pain.  Frequency of micturition -     Urinalysis, Routine w reflex microscopic -     Urine Culture  Suprapubic abdominal pain -     Urinalysis, Routine w reflex microscopic -     Urine Culture     Urinary tract infection Acute urinary tract infection with symptoms of dysuria, urgency, and lower abdominal pain since yesterday. No fever, chills, discharge, hematuria, or back pain. Previous urine culture in 2022 showed susceptibility to all antibiotics. Allergic to penicillins, no known sulfa  drug allergies. Urinalysis in office reveals cystitis with hematuria. Current urine culture sent to lab for susceptibility confirmation. - Prescribe Bactrim  twice daily for 7 days - Prescribe Pyridium  three times a day as needed for discomfort for up to 3 days - Advise increased water intake - Recommend cranberry juice consumption - Advise avoidance of caffeine - Instruct to report any worsening or new symptoms - Plan to contact if culture results necessitate a change in antibiotics          Continue all other maintenance medications.  Follow up plan: Return if symptoms worsen or fail to improve.   Continue healthy lifestyle choices, including diet (rich in fruits, vegetables, and lean proteins, and low in salt and simple carbohydrates) and exercise (at least 30 minutes of moderate physical activity daily).  Educational handout given for UTI  The above assessment and management plan was discussed with the patient. The patient verbalized understanding of and has agreed to the management plan. Patient is aware to call the clinic if they develop any new symptoms or if symptoms persist or worsen. Patient is aware when to return to the clinic for a follow-up visit. Patient educated on when it is appropriate to go to the emergency  department.   Rosaline Bruns, FNP-C [facility] [phone]

## 2024-06-13 ENCOUNTER — Ambulatory Visit: Payer: Self-pay | Admitting: *Deleted

## 2024-06-13 LAB — URINE CULTURE

## 2024-06-13 NOTE — Telephone Encounter (Signed)
 Patient aware

## 2024-06-13 NOTE — Progress Notes (Signed)
 Lmtcb  Review results when patient calls back

## 2024-06-13 NOTE — Telephone Encounter (Signed)
 Copied from CRM [id number]. Topic: Clinical - Lab/Test Results >> Jun 13, 2024  1:18 PM Gustabo D wrote: Go over labs Reason for Disposition  General information question, no triage required and triager able to answer question  Answer Assessment - Initial Assessment Questions 1. REASON FOR CALL: What is the main reason for your call? or How can I best help you?     I have questions about my lab results.   I got a call.    It's about the UTI.   2. SYMPTOMS : Do you have any symptoms?      UTI symptoms 3. OTHER QUESTIONS: Do you have any other questions?     No  Protocols used: Information Only Call - No Triage-A-AH FYI Only or Action Required?: FYI only for provider.  Patient was last seen in primary care on 06/09/2024 by Severa Rock HERO, FNP.  Called Nurse Triage reporting Advice Only (Lab result questions).  Symptoms began today. Read her the message from [facility], DO dated 06/13/2024 at 11:04 AM.   Pt is on the Bactrium for UTI.     Interventions attempted: Other: called back about urine results and if on right antibiotic.  Symptoms are: unchanged.  Triage Disposition: Information or Advice Only Call  Patient/caregiver understands and will follow disposition?: Yes

## 2024-06-14 NOTE — Progress Notes (Signed)
 Per other chart note, nurse [place] has already spoken with patient and reviewed lab results.

## 2025-01-02 ENCOUNTER — Encounter: Payer: Self-pay | Admitting: Family Medicine
